# Patient Record
Sex: Female | Born: 1995 | Race: Black or African American | Hispanic: No | Marital: Married | Smoking: Never smoker
Health system: Southern US, Community
[De-identification: ages and names within clinical notes are randomized; demographics above are authoritative.]

## PROBLEM LIST (undated history)

## (undated) ENCOUNTER — Inpatient Hospital Stay (HOSPITAL_COMMUNITY): Payer: Self-pay

## (undated) ENCOUNTER — Emergency Department (HOSPITAL_COMMUNITY): Admission: EM | Payer: BC Managed Care – PPO | Source: Home / Self Care

## (undated) DIAGNOSIS — J45909 Unspecified asthma, uncomplicated: Secondary | ICD-10-CM

## (undated) HISTORY — PX: NO PAST SURGERIES: SHX2092

---

## 2016-06-10 ENCOUNTER — Ambulatory Visit (HOSPITAL_COMMUNITY)
Admission: EM | Admit: 2016-06-10 | Discharge: 2016-06-10 | Disposition: A | Discharge status: Home / Self Care | Payer: Self-pay

## 2016-06-10 ENCOUNTER — Encounter (HOSPITAL_COMMUNITY): Payer: Self-pay | Admitting: Family Medicine

## 2016-06-10 DIAGNOSIS — S161XXA Strain of muscle, fascia and tendon at neck level, initial encounter: Secondary | ICD-10-CM
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
## 2017-12-23 ENCOUNTER — Emergency Department (HOSPITAL_COMMUNITY): Payer: Self-pay

## 2017-12-23 ENCOUNTER — Emergency Department (HOSPITAL_COMMUNITY)
Admission: EM | Admit: 2017-12-23 | Discharge: 2017-12-23 | Disposition: A | Discharge status: Home / Self Care | Payer: Self-pay | Attending: Emergency Medicine | Admitting: Emergency Medicine

## 2017-12-23 ENCOUNTER — Encounter (HOSPITAL_COMMUNITY): Payer: Self-pay

## 2017-12-23 DIAGNOSIS — B9789 Other viral agents as the cause of diseases classified elsewhere: Secondary | ICD-10-CM | POA: Insufficient documentation

## 2017-12-23 DIAGNOSIS — R062 Wheezing: Secondary | ICD-10-CM | POA: Insufficient documentation

## 2017-12-23 DIAGNOSIS — J3489 Other specified disorders of nose and nasal sinuses: Secondary | ICD-10-CM | POA: Insufficient documentation

## 2017-12-23 DIAGNOSIS — R0981 Nasal congestion: Secondary | ICD-10-CM | POA: Insufficient documentation

## 2017-12-23 DIAGNOSIS — J069 Acute upper respiratory infection, unspecified: Secondary | ICD-10-CM | POA: Insufficient documentation

## 2017-12-23 DIAGNOSIS — J45909 Unspecified asthma, uncomplicated: Secondary | ICD-10-CM | POA: Insufficient documentation

## 2017-12-23 DIAGNOSIS — Z209 Contact with and (suspected) exposure to unspecified communicable disease: Secondary | ICD-10-CM | POA: Insufficient documentation

## 2017-12-23 HISTORY — DX: Unspecified asthma, uncomplicated: J45.909

## 2017-12-23 MED ORDER — IPRATROPIUM-ALBUTEROL 0.5-2.5 (3) MG/3ML IN SOLN
3.0000 mL | Freq: Once | RESPIRATORY_TRACT | Status: AC
  Administered 2017-12-23: 3 mL via RESPIRATORY_TRACT
  Filled 2017-12-23: qty 3

## 2017-12-23 MED ORDER — CETIRIZINE HCL 10 MG PO TABS: 10.0000 mg | ORAL_TABLET | Freq: Every day | ORAL | 0 refills | Status: DC

## 2017-12-23 MED ORDER — ALBUTEROL SULFATE HFA 108 (90 BASE) MCG/ACT IN AERS
2.0000 | INHALATION_SPRAY | Freq: Once | RESPIRATORY_TRACT | Status: AC
  Administered 2017-12-23: 2 via RESPIRATORY_TRACT
  Filled 2017-12-23: qty 6.7

## 2017-12-23 MED ORDER — PREDNISONE 20 MG PO TABS
60.0000 mg | ORAL_TABLET | Freq: Once | ORAL | Status: AC
  Administered 2017-12-23: 60 mg via ORAL
  Filled 2017-12-23: qty 3

## 2017-12-23 MED ORDER — FLUTICASONE PROPIONATE 50 MCG/ACT NA SUSP: 2.0000 | Freq: Every day | NASAL | 0 refills | Status: DC

## 2017-12-23 MED ORDER — PREDNISONE 20 MG PO TABS: 40.0000 mg | ORAL_TABLET | Freq: Every day | ORAL | 0 refills | Status: AC

## 2017-12-23 NOTE — ED Provider Notes (Signed)
[facility]   CSN: [id number] Arrival date & time: 12/23/17  1642     History   Chief Complaint Chief Complaint  Patient presents with  . congestion/cough    HPI Shirley Navarro is a 22 y.o. female with a history of asthma who presents emergency department today for 2-day history of symptoms.  Patient states that 3 days ago she started having maxillary sinus pressure, congestion, rhinorrhea, and a dry, nonproductive cough.  She states that 2 days ago she had a subjective fever and has had chills since that time.  Yesterday she started having wheezing but is without home albuterol inhaler.  She has been taking over-the-counter sinus medication without relief.  No antipyretics prior to arrival.  Patient states that her last asthma exacerbation was one year ago.  No prior hospitalizations or intubations for asthma.  No daily medications for asthma.  She notes sick contacts.  Denies any headache, visual changes, sore throat, neck stiffness, rash, chest pain, chest tightness, shortness of breath, hemoptysis, lower leg swelling, abdominal pain, nausea/vomiting/diarrhea.  HPI  Past Medical History:  Diagnosis Date  . Asthma     There are no active problems to display for this patient.   History reviewed. No pertinent surgical history.  OB History    No data available       Home Medications    Prior to Admission medications   Not on File    Family History No family history on file.  Social History Social History   Tobacco Use  . Smoking status: Never Smoker  . Smokeless tobacco: Never Used  Substance Use Topics  . Alcohol use: Not on file  . Drug use: Not on file     Allergies   Patient has no known allergies.   Review of Systems Review of Systems  All other systems reviewed and are negative.    Physical Exam Updated Vital Signs BP 128/83 (BP Location: Right Arm)   Pulse 93   Temp 98.7 F (37.1 C)  (Oral)   Resp 18   LMP 12/14/2017   SpO2 100%   Physical Exam  Constitutional: She appears well-developed and well-nourished.  HENT:  Head: Normocephalic and atraumatic.  Right Ear: Tympanic membrane and external ear normal.  Left Ear: Tympanic membrane and external ear normal.  Nose: Mucosal edema present. No rhinorrhea. Right sinus exhibits maxillary sinus tenderness. Right sinus exhibits no frontal sinus tenderness. Left sinus exhibits maxillary sinus tenderness. Left sinus exhibits no frontal sinus tenderness.  Mouth/Throat: Uvula is midline, oropharynx is clear and moist and mucous membranes are normal. No tonsillar exudate.  The patient has normal phonation and is in control of secretions. No stridor.  Midline uvula without edema. Soft palate rises symmetrically.  No tonsillar erythema or exudates. No PTA. Tongue protrusion is normal. No trismus. No creptius on neck palpation and patient has good dentition. No gingival erythema or fluctuance noted. Mucus membranes moist.   Eyes: EOM are normal. Pupils are equal, round, and reactive to light. Right eye exhibits no discharge. Left eye exhibits no discharge. Right conjunctiva is not injected. Left conjunctiva is not injected. No scleral icterus. Right eye exhibits normal extraocular motion. Left eye exhibits normal extraocular motion.  Neck: Trachea normal, normal range of motion and full passive range of motion without pain. Neck supple. No spinous process tenderness present. No neck rigidity. Normal range of motion present.  No nuchal rigidity or meningismus  Cardiovascular: Normal rate, regular rhythm  and intact distal pulses.  No murmur heard. Pulses:      Radial pulses are 2+ on the right side, and 2+ on the left side.       Dorsalis pedis pulses are 2+ on the right side, and 2+ on the left side.       Posterior tibial pulses are 2+ on the right side, and 2+ on the left side.  No lower extremity swelling or edema. Calves symmetric in  size bilaterally.  Pulmonary/Chest: Effort normal. She has wheezes (end exp) in the right middle field and the right lower field. She exhibits no tenderness.  Abdominal: Soft. Bowel sounds are normal. She exhibits no distension. There is no tenderness. There is no rigidity, no rebound, no guarding and no CVA tenderness.  Musculoskeletal: She exhibits no edema.  Lymphadenopathy:    She has no cervical adenopathy.  Neurological: She is alert.  Skin: Skin is warm and dry. No petechiae, no purpura and no rash noted. She is not diaphoretic.  Psychiatric: She has a normal mood and affect.  Nursing note and vitals reviewed.    ED Treatments / Results  Labs (all labs ordered are listed, but only abnormal results are displayed) Labs Reviewed - No data to display  EKG  EKG Interpretation None       Radiology Dg Chest 2 View  Result Date: 12/23/2017 CLINICAL DATA:  22 year old female with 3 days of cough, congestion, fever. EXAM: CHEST - 2 VIEW COMPARISON:  None. FINDINGS: Normal lung volumes. Normal cardiac size and mediastinal contours. Visualized tracheal air column is within normal limits. No pneumothorax, pleural effusion or confluent pulmonary opacity. Mild diffuse increased pulmonary interstitial markings. No pulmonary edema suspected. No osseous abnormality identified. Negative visible bowel gas pattern. IMPRESSION: Mildly increased bilateral pulmonary interstitial and suspicious for acute viral/atypical respiratory infection in this clinical setting. No pleural effusion. Electronically Signed   By: Odessa Fleming M.D.   On: 12/23/2017 17:30    Procedures Procedures (including critical care time)  Medications Ordered in ED Medications  albuterol (PROVENTIL HFA;VENTOLIN HFA) 108 (90 Base) MCG/ACT inhaler 2 puff (2 puffs Inhalation Given 12/23/17 1749)  ipratropium-albuterol (DUONEB) 0.5-2.5 (3) MG/3ML nebulizer solution 3 mL (3 mLs Nebulization Given 12/23/17 1749)  predniSONE (DELTASONE)  tablet 60 mg (60 mg Oral Given 12/23/17 1749)     Initial Impression / Assessment and Plan / ED Course  I have reviewed the triage vital signs and the nursing notes.  Pertinent labs & imaging results that were available during my care of the patient were reviewed by me and considered in my medical decision making (see chart for details).     22 y.o. with subjective fevers, sinus pressure, congestion, rhinorrhea, and dry cough over the last 3 days. Xray negative for PNA. Patient exam c/w viral URI that has likely excaberated the patient asthma. Will treat the patient with duoneb in the department. After duoneb treatment patient states improvement of her symptoms. Lung exam with resolution of wheezing. Patient is satting at 100% on RA. Feel the patient can be treated on an outpatient bases. Albuterol inhaler given in the department. Patient will be discharged home on steroid taper. First dose given in department. Recommend conservative therapy. I advised the patient to follow-up with PCP this week. Specific return precautions discussed. Time was given for all questions to be answered. The patient verbalized understanding and agreement with plan. The patient appears safe for discharge home.  Final Clinical Impressions(s) / ED Diagnoses   Final  diagnoses:  Viral URI with cough  Wheezing    ED Discharge Orders        Ordered    fluticasone (FLONASE) 50 MCG/ACT nasal spray  Daily     12/23/17 1756    cetirizine (ZYRTEC) 10 MG tablet  Daily     12/23/17 1756    predniSONE (DELTASONE) 20 MG tablet  Daily     12/23/17 1756       Princella Pellegrini 12/23/17 1814    Tegeler, Canary Brim, MD 12/23/17 2351
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
## 2017-12-23 NOTE — ED Triage Notes (Signed)
Patient complains of 3 days of cough, congestion with chills and reported fever. States that she has asthma and out of inhaler. No wheezing noted, NAd

## 2017-12-23 NOTE — ED Notes (Signed)
Declined W/C at D/C and was escorted to lobby by RN. 

## 2017-12-26 ENCOUNTER — Encounter (HOSPITAL_COMMUNITY): Payer: Self-pay | Admitting: Family Medicine

## 2018-03-28 ENCOUNTER — Encounter (HOSPITAL_COMMUNITY): Payer: Self-pay

## 2018-03-28 ENCOUNTER — Emergency Department (HOSPITAL_COMMUNITY)
Admission: EM | Admit: 2018-03-28 | Discharge: 2018-03-28 | Disposition: A | Discharge status: Home / Self Care | Payer: Self-pay | Attending: Emergency Medicine | Admitting: Emergency Medicine

## 2018-03-28 DIAGNOSIS — J4521 Mild intermittent asthma with (acute) exacerbation: Secondary | ICD-10-CM | POA: Insufficient documentation

## 2018-03-28 DIAGNOSIS — Z79899 Other long term (current) drug therapy: Secondary | ICD-10-CM | POA: Insufficient documentation

## 2018-03-28 MED ORDER — ALBUTEROL SULFATE HFA 108 (90 BASE) MCG/ACT IN AERS: 1.0000 | INHALATION_SPRAY | Freq: Four times a day (QID) | RESPIRATORY_TRACT | 0 refills | Status: DC | PRN

## 2018-03-28 MED ORDER — AEROCHAMBER PLUS FLO-VU LARGE MISC
Status: AC
  Filled 2018-03-28: qty 1

## 2018-03-28 MED ORDER — ALBUTEROL SULFATE (2.5 MG/3ML) 0.083% IN NEBU
5.0000 mg | INHALATION_SOLUTION | Freq: Once | RESPIRATORY_TRACT | Status: AC
  Administered 2018-03-28: 5 mg via RESPIRATORY_TRACT
  Filled 2018-03-28: qty 6

## 2018-03-28 MED ORDER — AEROCHAMBER PLUS FLO-VU LARGE MISC
1.0000 | Freq: Once | Status: AC
  Administered 2018-03-28: 1

## 2018-03-28 NOTE — Discharge Instructions (Addendum)
Please read attached information. If you experience any new or worsening signs or symptoms please return to the emergency room for evaluation. Please follow-up with your primary care provider or specialist as discussed. Please use medication prescribed only as directed and discontinue taking if you have any concerning signs or symptoms.   °

## 2018-03-28 NOTE — ED Provider Notes (Signed)
[facility]   CSN: [id number] Arrival date & time: 03/28/18  1148     History   Chief Complaint Chief Complaint  Patient presents with  . Asthma    HPI Shirley Navarro is a 22 y.o. female.  HPI   22 year old female presents today with complaints of asthma exacerbation.  Patient she woke up at 4 AM this morning with chest tightness, wheezing and shortness of breath.  She notes she had an inhaler at home and used it but did not find relief of her symptoms.  She was told to come in by her work today for evaluation.  Patient notes a tightness in her anterior chest.  Prior to my evaluation she received breathing treatments which she notes significantly improved her symptoms both the tightness and the shortness of breath.  She denies any complicating features including fever or upper respiratory infectious symptoms.  Patient notes this is identical to previous episodes of asthma exacerbation.  Patient denies any history DVT or PE, denies any significant risk factors.  He is a non-smoker.  Past Medical History:  Diagnosis Date  . Asthma     There are no active problems to display for this patient.   History reviewed. No pertinent surgical history.   OB History   None      Home Medications    Prior to Admission medications   Medication Sig Start Date End Date Taking? Authorizing Provider  albuterol (PROVENTIL HFA;VENTOLIN HFA) 108 (90 Base) MCG/ACT inhaler Inhale 1-2 puffs into the lungs every 6 (six) hours as needed for wheezing or shortness of breath. 03/28/18   Hurman Ketelsen, Tinnie GensJeffrey, PA-C  cetirizine (ZYRTEC) 10 MG tablet Take 1 tablet (10 mg total) by mouth daily. 12/23/17   Maczis, Elmer SowMichael M, PA-C  fluticasone (FLONASE) 50 MCG/ACT nasal spray Place 2 sprays into both nostrils daily. 12/23/17   Maczis, Elmer SowMichael M, PA-C    Family History No family history on file.  Social History Social History   Tobacco Use  . Smoking  status: Never Smoker  . Smokeless tobacco: Never Used  Substance Use Topics  . Alcohol use: Not on file  . Drug use: Not on file     Allergies   Patient has no known allergies.   Review of Systems Review of Systems  All other systems reviewed and are negative.  Physical Exam Updated Vital Signs BP 117/74 (BP Location: Left Arm)   Pulse 74   Temp 98.1 F (36.7 C) (Oral)   Resp 16   SpO2 100%   Physical Exam  Constitutional: She is oriented to person, place, and time. She appears well-developed and well-nourished.  HENT:  Head: Normocephalic and atraumatic.  Eyes: Pupils are equal, round, and reactive to light. Conjunctivae are normal. Right eye exhibits no discharge. Left eye exhibits no discharge. No scleral icterus.  Neck: Normal range of motion. No JVD present. No tracheal deviation present.  Cardiovascular: Normal rate, regular rhythm, normal heart sounds and intact distal pulses. Exam reveals no gallop and no friction rub.  No murmur heard. Pulmonary/Chest: Effort normal and breath sounds normal. No stridor. No respiratory distress. She has no wheezes. She has no rales. She exhibits no tenderness.  Neurological: She is alert and oriented to person, place, and time. No cranial nerve deficit. Coordination normal.  Psychiatric: She has a normal mood and affect. Her behavior is normal. Judgment and thought content normal.  Nursing note and vitals reviewed.    ED Treatments /  Results  Labs (all labs ordered are listed, but only abnormal results are displayed) Labs Reviewed - No data to display  EKG None  Radiology No results found.  Procedures Procedures (including critical care time)  Medications Ordered in ED Medications  AEROCHAMBER PLUS FLO-VU LARGE MISC (has no administration in time range)  albuterol (PROVENTIL) (2.5 MG/3ML) 0.083% nebulizer solution 5 mg (5 mg Nebulization Given 03/28/18 1159)  AEROCHAMBER PLUS FLO-VU LARGE MISC 1 each (1 each Other  Given 03/28/18 1302)     Initial Impression / Assessment and Plan / ED Course  I have reviewed the triage vital signs and the nursing notes.  Pertinent labs & imaging results that were available during my care of the patient were reviewed by me and considered in my medical decision making (see chart for details).     Labs:   Imaging:  Consults:  Therapeutics:  Discharge Meds:   Assessment/Plan: 22 year old female presents today with asthma exacerbation.  She has clear lung sounds on my evaluation heart rate is 74 oxygen 100.  She has no signs of infectious etiology.  No signs of pulmonary embolism or any other acute life-threatening etiology.  She will be discharged with outpatient follow-up, space of her inhaler and strict return precautions.  She verbalized understanding and agreement to today's plan.   Final Clinical Impressions(s) / ED Diagnoses   Final diagnoses:  Mild intermittent asthma with exacerbation    ED Discharge Orders        Ordered    albuterol (PROVENTIL HFA;VENTOLIN HFA) 108 (90 Base) MCG/ACT inhaler  Every 6 hours PRN     03/28/18 1259       Eyvonne Mechanic, PA-C 03/28/18 1408    Gerhard Munch, MD 03/29/18 1343

## 2018-03-28 NOTE — ED Triage Notes (Signed)
Pt presents for evaluation of asthma flare up. Pt reports sob and intermittent chest tightness with coughing and breathing around 4 am this morning. Reports no PCP so doesn't have home inhaler. Ambualtory, speaking in complete sentences.

## 2018-05-06 ENCOUNTER — Emergency Department (HOSPITAL_COMMUNITY)
Admission: EM | Admit: 2018-05-06 | Discharge: 2018-05-06 | Disposition: A | Discharge status: Home / Self Care | Payer: Self-pay | Attending: Emergency Medicine | Admitting: Emergency Medicine

## 2018-05-06 ENCOUNTER — Other Ambulatory Visit: Payer: Self-pay

## 2018-05-06 ENCOUNTER — Encounter (HOSPITAL_COMMUNITY): Payer: Self-pay | Admitting: Emergency Medicine

## 2018-05-06 DIAGNOSIS — Y9389 Activity, other specified: Secondary | ICD-10-CM | POA: Insufficient documentation

## 2018-05-06 DIAGNOSIS — Y990 Civilian activity done for income or pay: Secondary | ICD-10-CM | POA: Insufficient documentation

## 2018-05-06 DIAGNOSIS — Y33XXXA Other specified events, undetermined intent, initial encounter: Secondary | ICD-10-CM | POA: Insufficient documentation

## 2018-05-06 DIAGNOSIS — Y9289 Other specified places as the place of occurrence of the external cause: Secondary | ICD-10-CM | POA: Insufficient documentation

## 2018-05-06 DIAGNOSIS — S39012A Strain of muscle, fascia and tendon of lower back, initial encounter: Secondary | ICD-10-CM | POA: Insufficient documentation

## 2018-05-06 LAB — POC URINE PREG, ED: PREG TEST UR: NEGATIVE

## 2018-05-06 MED ORDER — IBUPROFEN 800 MG PO TABS
800.0000 mg | ORAL_TABLET | Freq: Once | ORAL | Status: AC
  Administered 2018-05-06: 800 mg via ORAL
  Filled 2018-05-06: qty 1

## 2018-05-06 MED ORDER — CYCLOBENZAPRINE HCL 10 MG PO TABS: 10.0000 mg | ORAL_TABLET | Freq: Two times a day (BID) | ORAL | 0 refills | Status: DC | PRN

## 2018-05-06 MED ORDER — IBUPROFEN 600 MG PO TABS: 600.0000 mg | ORAL_TABLET | Freq: Four times a day (QID) | ORAL | 0 refills | Status: DC | PRN

## 2018-05-06 NOTE — ED Notes (Signed)
Patient verbalizes understanding of discharge instructions. Opportunity for questioning and answers were provided. Armband removed by staff, pt discharged from ED ambulatory.   

## 2018-05-06 NOTE — ED Notes (Signed)
Patient states back pain x 2 days, starting at work. Patient states aleve, heating pad do not work. Patient states pain constant with increasing pain at time x 2 days.

## 2018-05-06 NOTE — ED Notes (Signed)
Pt aware of need for urine sample, cup provided.

## 2018-05-06 NOTE — ED Triage Notes (Signed)
Pt. Stated, Ive had back pain for 24 hours. 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

## 2018-05-06 NOTE — ED Provider Notes (Signed)
[facility] Provider Note   CSN: [id number] Arrival date & time: 05/06/18  0753     History   Chief Complaint Chief Complaint  Patient presents with  . Back Pain    HPI Sabra HeckZykeria Stanbrough is a 22 y.o. female.  The history is provided by the patient. No language interpreter was used.  Back Pain   Pertinent negatives include no fever, no numbness and no dysuria.     22 year old female presenting for evaluation of back pain.  Patient works as a LawyerCNA on night shift.  2 nights ago after her shift she noticed pain to her lower back.  She described pain as a sharp throbbing achy sensation, with tightness, worsening with movement and improves with rest.  Pain is currently rated as 8 out of 10.  She has back pain in the past but this is more intense.  Pain did not radiates down to her back to her abdomen.  She denies any associated fever, chills, lightheadedness, dizziness, hematuria, dysuria, bowel bladder incontinence or saddle anesthesia.  She does endorse a prolonged menstruation for the past 20 days which is unusual for her.  She has took Advil yesterday.  She was able to finish her night shift tonight with assistance.  Past Medical History:  Diagnosis Date  . Asthma     There are no active problems to display for this patient.   History reviewed. No pertinent surgical history.   OB History   None      Home Medications    Prior to Admission medications   Medication Sig Start Date End Date Taking? Authorizing Provider  albuterol (PROVENTIL HFA;VENTOLIN HFA) 108 (90 Base) MCG/ACT inhaler Inhale 1-2 puffs into the lungs every 6 (six) hours as needed for wheezing or shortness of breath. 03/28/18   Hedges, Tinnie GensJeffrey, PA-C  cetirizine (ZYRTEC) 10 MG tablet Take 1 tablet (10 mg total) by mouth daily. 12/23/17   Maczis, Elmer SowMichael M, PA-C  fluticasone (FLONASE) 50 MCG/ACT nasal spray Place 2 sprays into both nostrils daily. 12/23/17   Maczis, Elmer SowMichael M,  PA-C    Family History No family history on file.  Social History Social History   Tobacco Use  . Smoking status: Never Smoker  . Smokeless tobacco: Never Used  Substance Use Topics  . Alcohol use: Not on file  . Drug use: Not on file     Allergies   Codeine   Review of Systems Review of Systems  Constitutional: Negative for fever.  Genitourinary: Negative for dysuria and hematuria.  Musculoskeletal: Positive for back pain.  Neurological: Negative for numbness.     Physical Exam Updated Vital Signs BP 123/66 (BP Location: Right Arm)   Pulse 85   Temp 98.7 F (37.1 C) (Oral)   Resp 17   Ht 5\' 5"  (1.651 m)   Wt 70.8 kg (156 lb)   LMP 04/14/2018   SpO2 100%   BMI 25.96 kg/m   Physical Exam  Constitutional: She appears well-developed and well-nourished. No distress.  HENT:  Head: Atraumatic.  Eyes: Conjunctivae are normal.  Neck: Neck supple.  Abdominal: Soft. She exhibits no distension. There is no tenderness.  Musculoskeletal: She exhibits tenderness (Tenderness along lumbar spine and left paralumbar spinal muscle on palpation with full range of motion.  Negative straight leg raise.).  Neurological: She is alert.  Patellar deep tendon reflex intact bilaterally, no foot drops  Skin: No rash noted.  Psychiatric: She has a normal mood and affect.  Nursing note and vitals reviewed.    ED Treatments / Results  Labs (all labs ordered are listed, but only abnormal results are displayed) Labs Reviewed  POC URINE PREG, ED    EKG None  Radiology No results found.  Procedures Procedures (including critical care time)  Medications Ordered in ED Medications - No data to display   Initial Impression / Assessment and Plan / ED Course  I have reviewed the triage vital signs and the nursing notes.  Pertinent labs & imaging results that were available during my care of the patient were reviewed by me and considered in my medical decision making (see  chart for details).     BP 123/66 (BP Location: Right Arm)   Pulse 85   Temp 98.7 F (37.1 C) (Oral)   Resp 17   Ht 5\' 5"  (1.651 m)   Wt 70.8 kg (156 lb)   LMP 04/14/2018   SpO2 100%   BMI 25.96 kg/m    Final Clinical Impressions(s) / ED Diagnoses   Final diagnoses:  Strain of lumbar region, initial encounter    ED Discharge Orders    None     8:49 AM Patient here with reproducible back pain.  No red flags.  Able to ambulate.  She is neurovascularly intact.  Suspect this is likely to be muscle skeletal strain due to her job as a Doctor, hospital heavy patient.  Low suspicion for fracture or dislocation.  She did report a prolonged menstrual.  Lasting for 20 days.  She does not have any abdominal discomfort however will obtain pregnancy test prior to discharge.  BP 123/66 (BP Location: Right Arm)   Pulse 85   Temp 98.7 F (37.1 C) (Oral)   Resp 17   Ht 5\' 5"  (1.651 m)   Wt 70.8 kg (156 lb)   LMP 04/14/2018   SpO2 100%   BMI 25.96 kg/m  Pregnancy test is negative.  Patient given ibuprofen for her back pain.  Patient discharged home with muscle relaxant and anti-inflammatory medication.  Orthopedic referral given as needed.  Work note provided.  Rice therapy discussed.   Fayrene Helper, PA-C 05/06/18 1610    Blane Ohara, MD 05/07/18 [phone]

## 2019-02-13 IMAGING — DX DG CHEST 2V
2 series · 2 of 2 positions shown · non-contrast
Comparison: None.

CLINICAL DATA: 21-year-old female with 3 days of cough, congestion,
fever.

EXAM:
CHEST - 2 VIEW

[chest pa]
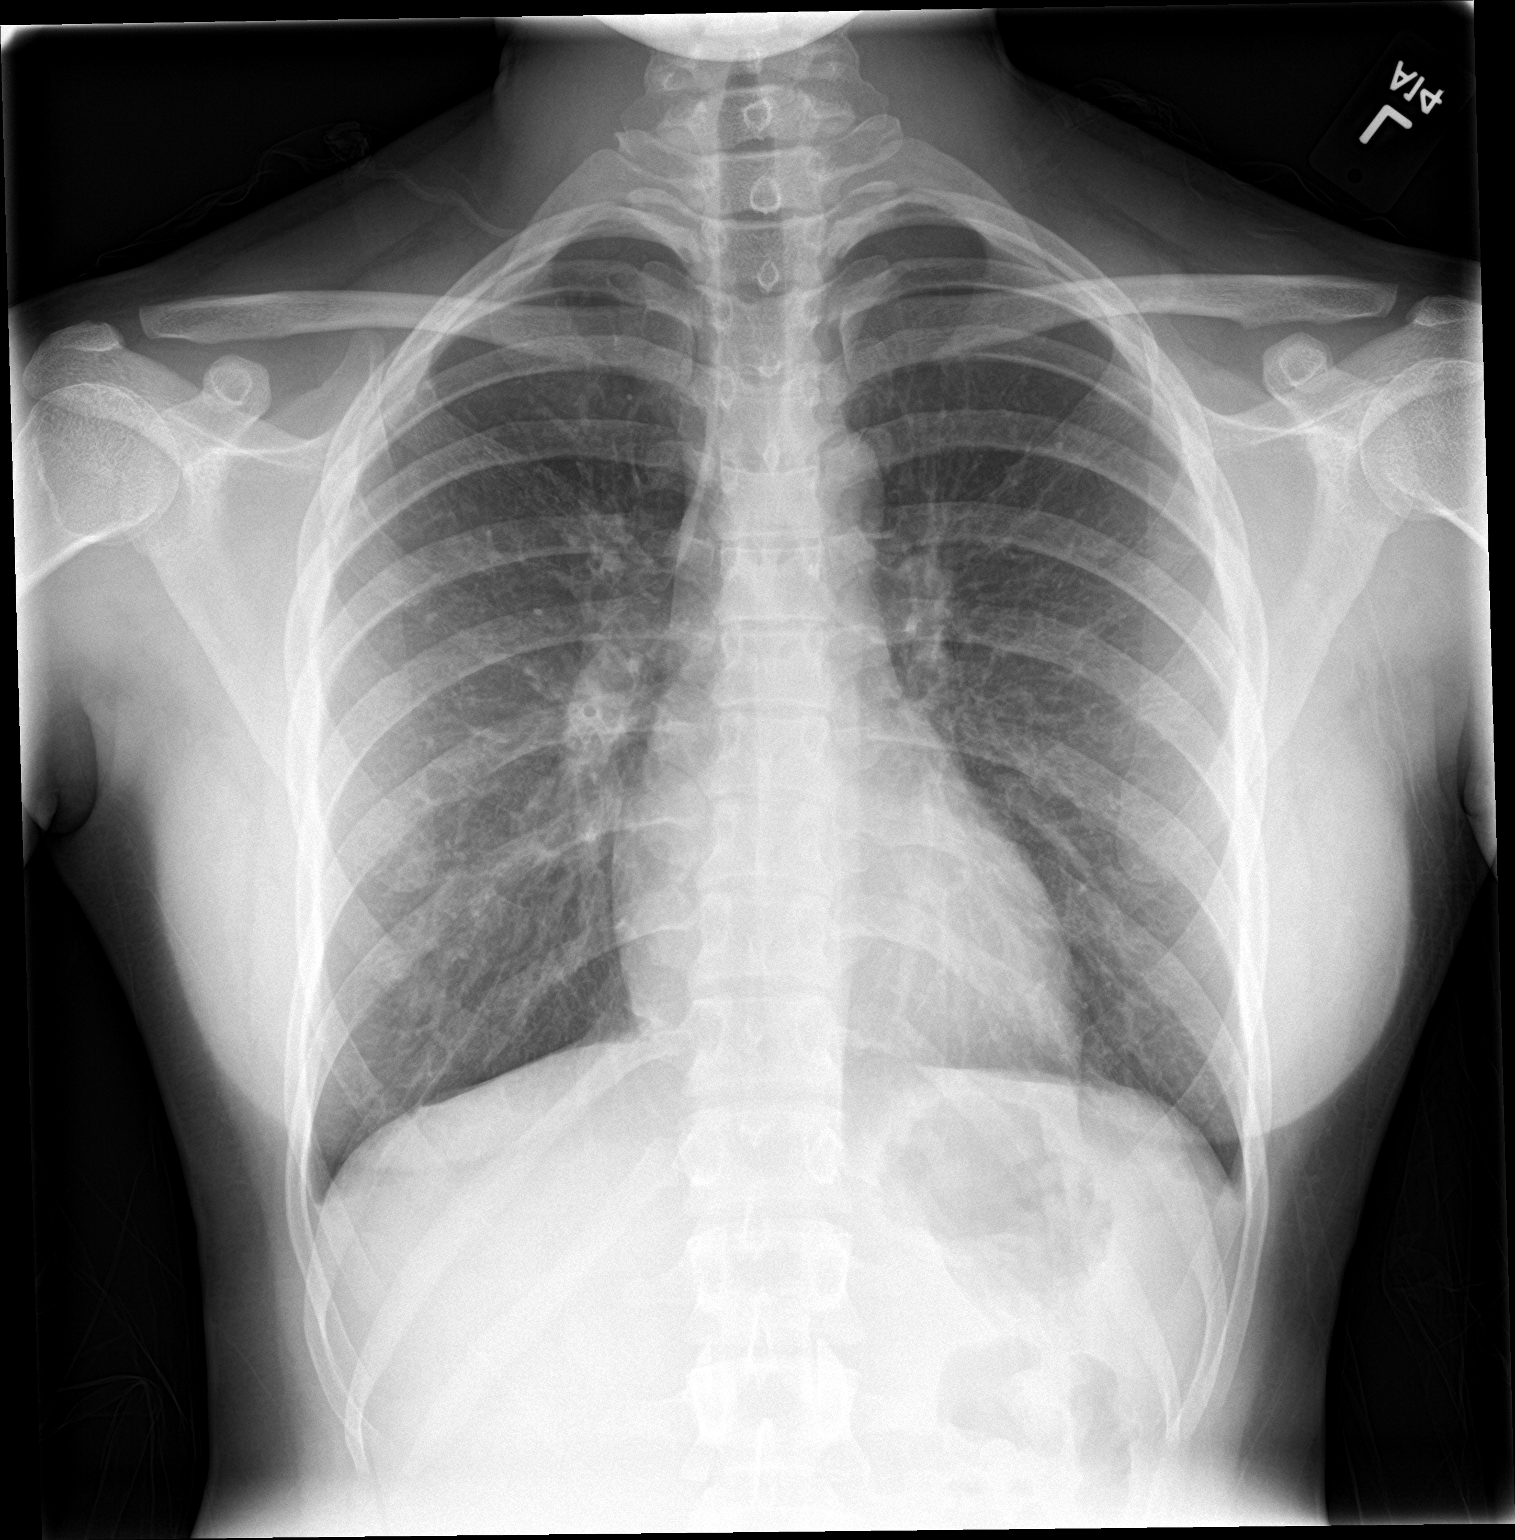

[chest lat]
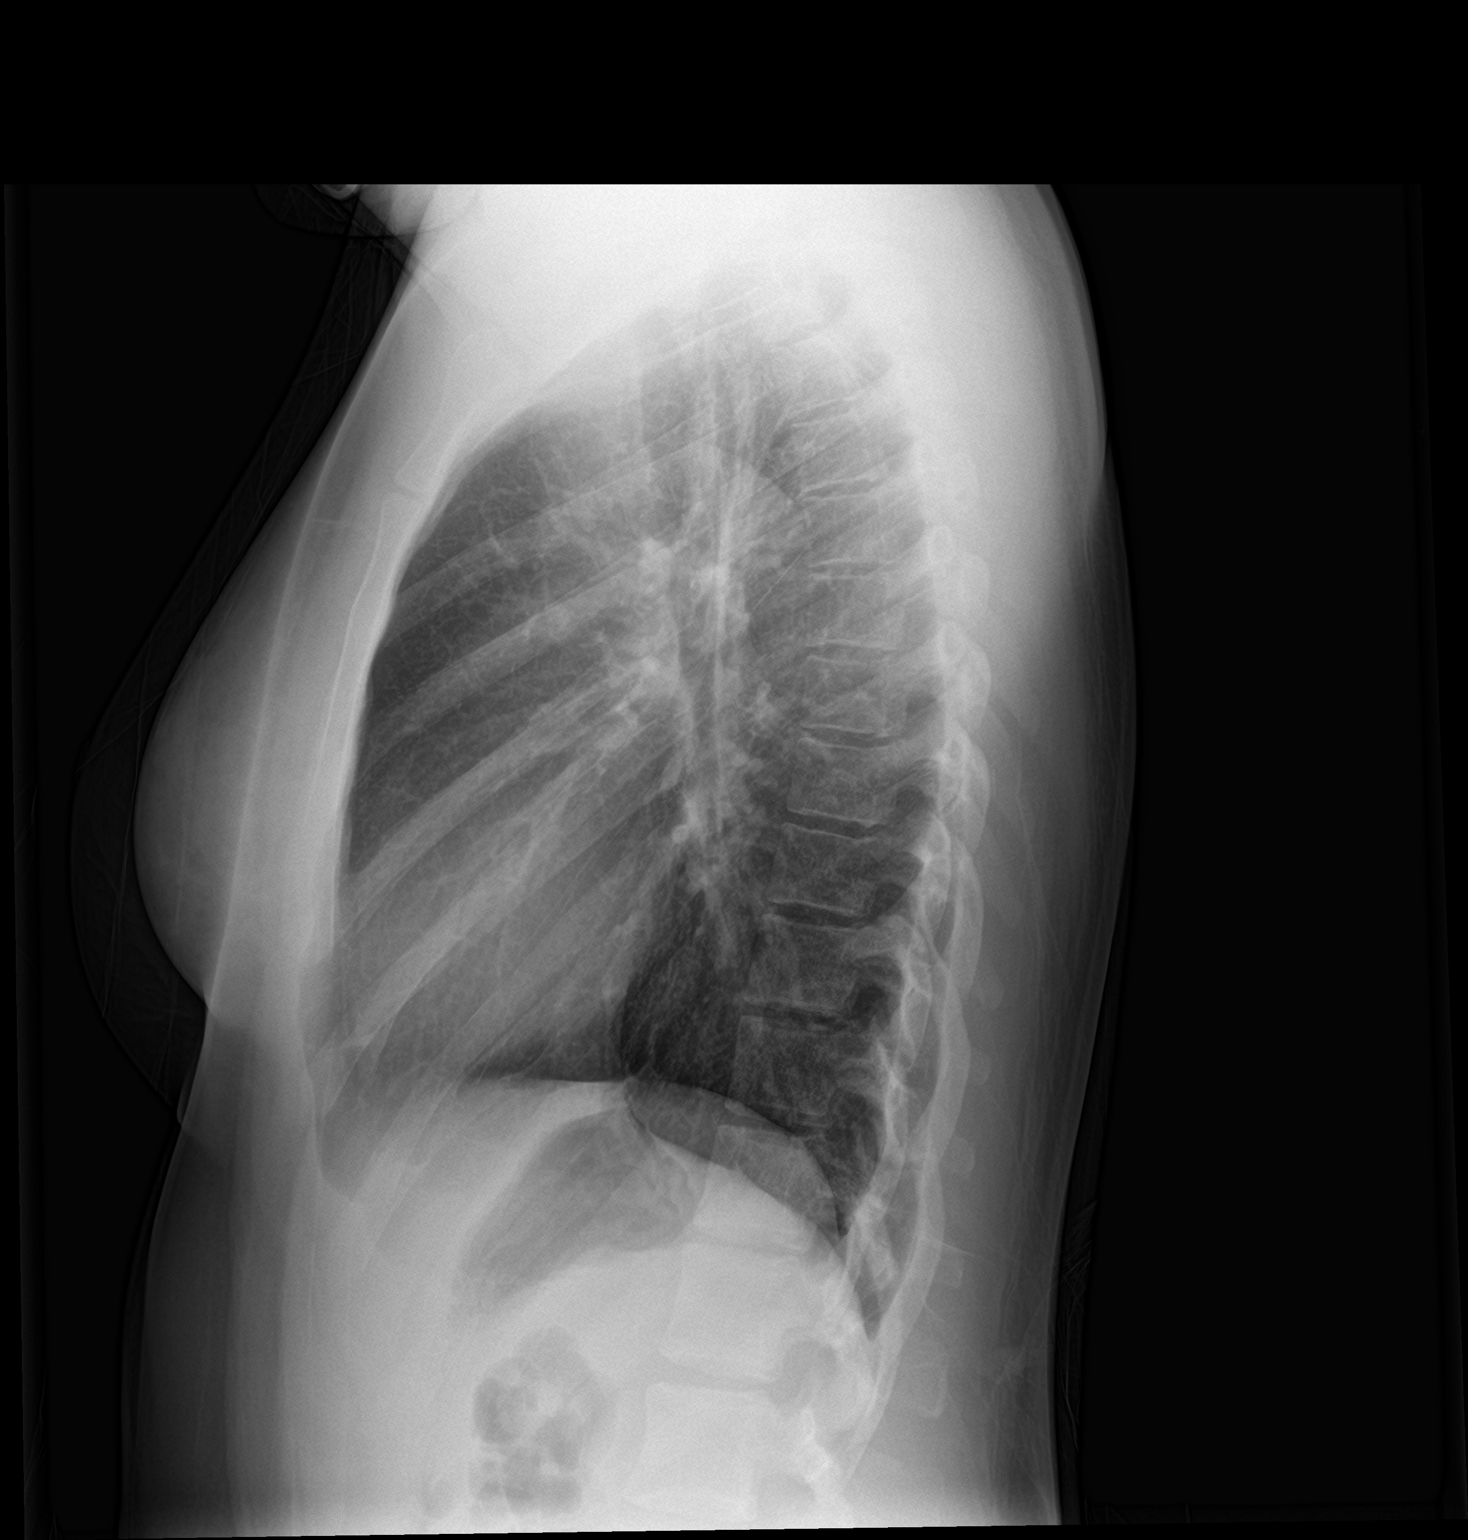

[2 of 2 positions shown; findings below may reference images not displayed]

FINDINGS: Normal lung volumes. Normal cardiac size and mediastinal contours.
Visualized tracheal air column is within normal limits. No
pneumothorax, pleural effusion or confluent pulmonary opacity. Mild
diffuse increased pulmonary interstitial markings. No pulmonary
edema suspected. No osseous abnormality identified. Negative visible
bowel gas pattern.
IMPRESSION: Mildly increased bilateral pulmonary interstitial and suspicious for
acute viral/atypical respiratory infection in this clinical setting.
No pleural effusion.

## 2019-03-12 ENCOUNTER — Ambulatory Visit (HOSPITAL_COMMUNITY)
Admission: EM | Admit: 2019-03-12 | Discharge: 2019-03-12 | Disposition: A | Discharge status: Home / Self Care | Payer: BC Managed Care – PPO | Attending: Family Medicine | Admitting: Family Medicine

## 2019-03-12 ENCOUNTER — Ambulatory Visit (HOSPITAL_COMMUNITY)
Admission: RE | Admit: 2019-03-12 | Discharge: 2019-03-12 | Disposition: A | Discharge status: Home / Self Care | Payer: BC Managed Care – PPO | Source: Ambulatory Visit | Attending: Family Medicine | Admitting: Family Medicine

## 2019-03-12 ENCOUNTER — Other Ambulatory Visit: Payer: Self-pay

## 2019-03-12 ENCOUNTER — Telehealth (HOSPITAL_COMMUNITY): Payer: Self-pay | Admitting: Family Medicine

## 2019-03-12 ENCOUNTER — Encounter (HOSPITAL_COMMUNITY): Payer: Self-pay | Admitting: Family Medicine

## 2019-03-12 DIAGNOSIS — M79602 Pain in left arm: Secondary | ICD-10-CM | POA: Insufficient documentation

## 2019-03-12 DIAGNOSIS — M79609 Pain in unspecified limb: Secondary | ICD-10-CM | POA: Diagnosis not present

## 2019-03-12 MED ORDER — NAPROXEN 500 MG PO TABS: 500.0000 mg | ORAL_TABLET | Freq: Two times a day (BID) | ORAL | 0 refills | Status: DC

## 2019-03-12 NOTE — Telephone Encounter (Signed)
Negative ultrasound

## 2019-03-12 NOTE — ED Provider Notes (Signed)
[facility]    CSN: [id number] Arrival date & time: 03/12/19  1753     History   Chief Complaint Chief Complaint  Patient presents with   Arm Pain    HPI Shirley Navarro is a 23 y.o. female.   Established [facility] patient.  23 yo woman complaining of left arm pain and numbness.  This began a day or two ago and is worsening, with the most intense pain over her Nexplanon (23 years old).  No recent exercise, trauma, fever, hand swelling.  No treatment attempted.  Patient works as Lawyer     Past Medical History:  Diagnosis Date   Asthma     There are no active problems to display for this patient.   History reviewed. No pertinent surgical history.  OB History   No obstetric history on file.      Home Medications    Prior to Admission medications   Medication Sig Start Date End Date Taking? Authorizing Provider  albuterol (PROVENTIL HFA;VENTOLIN HFA) 108 (90 Base) MCG/ACT inhaler Inhale 1-2 puffs into the lungs every 6 (six) hours as needed for wheezing or shortness of breath. 03/28/18   Eyvonne Mechanic, PA-C    Family History History reviewed. No pertinent family history.  Social History Social History   Tobacco Use   Smoking status: Never Smoker   Smokeless tobacco: Never Used  Substance Use Topics   Alcohol use: Not on file   Drug use: Not on file     Allergies   Codeine   Review of Systems Review of Systems  Respiratory: Negative for shortness of breath.   All other systems reviewed and are negative.    Physical Exam Triage Vital Signs ED Triage Vitals  Enc Vitals Group     BP      Pulse      Resp      Temp      Temp src      SpO2      Weight      Height      Head Circumference      Peak Flow      Pain Score      Pain Loc      Pain Edu?      Excl. in GC?    No data found.  Updated Vital Signs BP 110/82 (BP Location: Right Arm)    Pulse 79    Temp 98.5 F (36.9 C) (Oral)    Resp 16    Wt 61.2 kg    SpO2 98%     BMI 22.47 kg/m    Physical Exam Vitals signs and nursing note reviewed.  Constitutional:      Appearance: Normal appearance.  HENT:     Head: Normocephalic.  Eyes:     Conjunctiva/sclera: Conjunctivae normal.  Pulmonary:     Effort: Pulmonary effort is normal.  Musculoskeletal: Normal range of motion.        General: Tenderness present.     Comments: Tender LUE inside of biceps with especially tender Nexplanon sub q site.  Tenderness extends to proximal volar left forearm and up the arm to the shoulder.  Skin:    General: Skin is warm and dry.  Neurological:     General: No focal deficit present.     Mental Status: She is alert.  Psychiatric:        Mood and Affect: Mood normal.        Behavior: Behavior normal.  Thought Content: Thought content normal.        Judgment: Judgment normal.      UC Treatments / Results  Labs (all labs ordered are listed, but only abnormal results are displayed) Labs Reviewed - No data to display  EKG None  Radiology No results found.  Procedures Procedures (including critical care time)  Medications Ordered in UC Medications - No data to display  Initial Impression / Assessment and Plan / UC Course  I have reviewed the triage vital signs and the nursing notes.  Pertinent labs & imaging results that were available during my care of the patient were reviewed by me and considered in my medical decision making (see chart for details).   this appears to be a basilic vein phlebitis.   Final Clinical Impressions(s) / UC Diagnoses   Final diagnoses:  Left arm pain     Discharge Instructions     Go to the emergency department right now and they will do the venous doppler of your left arm    ED Prescriptions    None     Controlled Substance Prescriptions [place] Controlled Substance Registry consulted? Not Applicable   Elvina SidleLauenstein, Kimball Appleby, MD 03/12/19 [phone]

## 2019-03-12 NOTE — Progress Notes (Signed)
Left upper extremity venous duplex has been completed. Preliminary results can be found in CV Proc through chart review.   03/12/19 7:12 PM Olen Cordial RVT

## 2019-03-12 NOTE — Discharge Instructions (Addendum)
Go to the emergency department right now and they will do the venous doppler of your left arm

## 2019-03-12 NOTE — ED Triage Notes (Signed)
Pt states she has pain in her left arm where her birth control implant is. Pt states her arm is numb painful.

## 2019-03-20 DIAGNOSIS — Z20828 Contact with and (suspected) exposure to other viral communicable diseases: Secondary | ICD-10-CM | POA: Diagnosis not present

## 2019-06-11 DIAGNOSIS — Z3046 Encounter for surveillance of implantable subdermal contraceptive: Secondary | ICD-10-CM | POA: Diagnosis not present

## 2019-06-11 DIAGNOSIS — Z8619 Personal history of other infectious and parasitic diseases: Secondary | ICD-10-CM | POA: Diagnosis not present

## 2019-07-01 DIAGNOSIS — Z01419 Encounter for gynecological examination (general) (routine) without abnormal findings: Secondary | ICD-10-CM | POA: Diagnosis not present

## 2019-07-01 DIAGNOSIS — Z6824 Body mass index (BMI) 24.0-24.9, adult: Secondary | ICD-10-CM | POA: Diagnosis not present

## 2019-07-01 DIAGNOSIS — N898 Other specified noninflammatory disorders of vagina: Secondary | ICD-10-CM | POA: Diagnosis not present

## 2019-09-04 DIAGNOSIS — Z1159 Encounter for screening for other viral diseases: Secondary | ICD-10-CM | POA: Diagnosis not present

## 2019-09-04 DIAGNOSIS — Z118 Encounter for screening for other infectious and parasitic diseases: Secondary | ICD-10-CM | POA: Diagnosis not present

## 2019-09-04 DIAGNOSIS — Z113 Encounter for screening for infections with a predominantly sexual mode of transmission: Secondary | ICD-10-CM | POA: Diagnosis not present

## 2019-09-04 DIAGNOSIS — Z114 Encounter for screening for human immunodeficiency virus [HIV]: Secondary | ICD-10-CM | POA: Diagnosis not present

## 2019-09-04 DIAGNOSIS — R1031 Right lower quadrant pain: Secondary | ICD-10-CM | POA: Diagnosis not present

## 2019-09-04 LAB — HEPATITIS C ANTIBODY: HCV Ab: NEGATIVE

## 2019-09-10 DIAGNOSIS — R1031 Right lower quadrant pain: Secondary | ICD-10-CM | POA: Diagnosis not present

## 2019-10-02 DIAGNOSIS — L292 Pruritus vulvae: Secondary | ICD-10-CM | POA: Diagnosis not present

## 2019-10-02 DIAGNOSIS — B373 Candidiasis of vulva and vagina: Secondary | ICD-10-CM | POA: Diagnosis not present

## 2019-10-02 DIAGNOSIS — Z118 Encounter for screening for other infectious and parasitic diseases: Secondary | ICD-10-CM | POA: Diagnosis not present

## 2019-10-11 NOTE — L&D Delivery Note (Addendum)
Shirley Navarro is a 24 y.o.@ s/p vaginal delivery at [redacted]w[redacted]d.  She was admitted for post dates eIOL.    ROM: 9h 67m with clear fluid GBS Status: positive Maximum Maternal Temperature: 98.6 F   Labor Progress: Patient in latent labor on admission. AROM was performed for clear white fluid. Pitocin was also initiated and titrated appropriately. Patient was later noted to have complete cervical dilation. She then delivered without complication as noted below.   Delivery Date/Time: 09/19/2020 at 0433 Delivery: Called to room and patient was complete and pushing. Head delivered ROP. No nuchal cord present. Shoulder and body delivered in usual fashion. Infant with spontaneous cry, placed on mother's abdomen, dried and stimulated. Cord clamped x 2 after 1-minute delay, and cut by father of baby under my direct supervision. Cord blood drawn. Placenta delivered spontaneously with gentle cord traction. Fundus firm with massage and Pitocin. Labia, perineum, vagina, and cervix were inspected; 1st degree left labial laceration visualized with repair.    Placenta: intact, 3-vessel cord, sent to L&D Complications: none Lacerations: 1st degree left labial with repair  EBL: 200 ml Analgesia: epidural   Infant: female   APGARs 9 & 9   weight per medical record  Anupa Ganta, DO  I was present and gloved for entirety of the delivery. I agree with the findings and the plan of care as documented in the residents note.  Casper Harrison, MD [facility] Fellow, [facility], [facility]

## 2019-12-19 DIAGNOSIS — Z114 Encounter for screening for human immunodeficiency virus [HIV]: Secondary | ICD-10-CM | POA: Diagnosis not present

## 2019-12-19 DIAGNOSIS — Z118 Encounter for screening for other infectious and parasitic diseases: Secondary | ICD-10-CM | POA: Diagnosis not present

## 2019-12-19 DIAGNOSIS — Z113 Encounter for screening for infections with a predominantly sexual mode of transmission: Secondary | ICD-10-CM | POA: Diagnosis not present

## 2019-12-19 DIAGNOSIS — Z1159 Encounter for screening for other viral diseases: Secondary | ICD-10-CM | POA: Diagnosis not present

## 2020-01-07 DIAGNOSIS — R4586 Emotional lability: Secondary | ICD-10-CM | POA: Diagnosis not present

## 2020-01-07 DIAGNOSIS — F17200 Nicotine dependence, unspecified, uncomplicated: Secondary | ICD-10-CM | POA: Diagnosis not present

## 2020-01-13 DIAGNOSIS — Z3200 Encounter for pregnancy test, result unknown: Secondary | ICD-10-CM | POA: Diagnosis not present

## 2020-01-13 DIAGNOSIS — Z3689 Encounter for other specified antenatal screening: Secondary | ICD-10-CM | POA: Diagnosis not present

## 2020-01-13 DIAGNOSIS — O26851 Spotting complicating pregnancy, first trimester: Secondary | ICD-10-CM | POA: Diagnosis not present

## 2020-01-13 DIAGNOSIS — Z3A01 Less than 8 weeks gestation of pregnancy: Secondary | ICD-10-CM | POA: Diagnosis not present

## 2020-01-17 DIAGNOSIS — O26859 Spotting complicating pregnancy, unspecified trimester: Secondary | ICD-10-CM | POA: Diagnosis not present

## 2020-01-17 DIAGNOSIS — Z3A08 8 weeks gestation of pregnancy: Secondary | ICD-10-CM | POA: Diagnosis not present

## 2020-01-23 DIAGNOSIS — Z3201 Encounter for pregnancy test, result positive: Secondary | ICD-10-CM | POA: Diagnosis not present

## 2020-01-23 DIAGNOSIS — Z0000 Encounter for general adult medical examination without abnormal findings: Secondary | ICD-10-CM | POA: Diagnosis not present

## 2020-02-05 DIAGNOSIS — Z3201 Encounter for pregnancy test, result positive: Secondary | ICD-10-CM | POA: Diagnosis not present

## 2020-02-07 ENCOUNTER — Other Ambulatory Visit: Payer: Self-pay

## 2020-02-07 ENCOUNTER — Inpatient Hospital Stay (HOSPITAL_COMMUNITY)
Admission: AD | Admit: 2020-02-07 | Discharge: 2020-02-07 | Disposition: A | Discharge status: Home / Self Care | Payer: BC Managed Care – PPO | Attending: Obstetrics & Gynecology | Admitting: Obstetrics & Gynecology

## 2020-02-07 ENCOUNTER — Ambulatory Visit (HOSPITAL_COMMUNITY)
Admission: EM | Admit: 2020-02-07 | Discharge: 2020-02-07 | Disposition: A | Discharge status: Home / Self Care | Payer: BC Managed Care – PPO | Source: Home / Self Care

## 2020-02-07 ENCOUNTER — Encounter (HOSPITAL_COMMUNITY): Payer: Self-pay | Admitting: Obstetrics & Gynecology

## 2020-02-07 ENCOUNTER — Inpatient Hospital Stay (HOSPITAL_COMMUNITY): Payer: BC Managed Care – PPO

## 2020-02-07 DIAGNOSIS — O26891 Other specified pregnancy related conditions, first trimester: Secondary | ICD-10-CM

## 2020-02-07 DIAGNOSIS — Z3A08 8 weeks gestation of pregnancy: Secondary | ICD-10-CM | POA: Diagnosis not present

## 2020-02-07 DIAGNOSIS — Z3491 Encounter for supervision of normal pregnancy, unspecified, first trimester: Secondary | ICD-10-CM

## 2020-02-07 DIAGNOSIS — R109 Unspecified abdominal pain: Secondary | ICD-10-CM | POA: Diagnosis not present

## 2020-02-07 DIAGNOSIS — Z885 Allergy status to narcotic agent status: Secondary | ICD-10-CM | POA: Insufficient documentation

## 2020-02-07 LAB — URINALYSIS, ROUTINE W REFLEX MICROSCOPIC
Bilirubin Urine: NEGATIVE
Glucose, UA: NEGATIVE mg/dL
Hgb urine dipstick: NEGATIVE
Ketones, ur: NEGATIVE mg/dL
Leukocytes,Ua: NEGATIVE
Nitrite: NEGATIVE
Protein, ur: NEGATIVE mg/dL
Specific Gravity, Urine: 1.014 (ref 1.005–1.030)
pH: 8 (ref 5.0–8.0)

## 2020-02-07 LAB — POCT PREGNANCY, URINE: Preg Test, Ur: POSITIVE — AB

## 2020-02-07 NOTE — MAU Note (Signed)
Presents with c/o lower abdominal pain, reports constant pressure sensation & intermittent sharp pains bilaterally in lower abdomen.  Reports pain also radiates into lower back.  Denies VB.  LMP 12/12/2019.

## 2020-02-07 NOTE — MAU Provider Note (Signed)
Chief Complaint: Abdominal Pain and Back Pain   First Provider Initiated Contact with Patient 02/07/20 0955     SUBJECTIVE HPI: Shirley Navarro is a 24 y.o. G1P0 at [redacted]w[redacted]d who presents to Maternity Admissions reporting abdominal pain. Symptoms started last night. Reports suprapubic pressure and sharp pains that alternate sides. Has been having nausea & vomiting throughout the pregnancy but hasn't vomited today. Denies dysuria, diarrhea, fever, vaginal bleeding, or vaginal discharge. Saw Dr. Juliene Pina on Wednesday and had a normal ultrasound.   Location: abdomen Quality: pressure & cramping Severity: 7/10 on pain scale Duration: 1 day Timing: intermittent Modifying factors: none Associated signs and symptoms: nausea  Past Medical History:  Diagnosis Date  . Asthma    OB History  Gravida Para Term Preterm AB Living  1            SAB TAB Ectopic Multiple Live Births               # Outcome Date GA Lbr Len/2nd Weight Sex Delivery Anes PTL Lv  1 Current            Past Surgical History:  Procedure Laterality Date  . NO PAST SURGERIES     Social History   Socioeconomic History  . Marital status: Married    Spouse name: Not on file  . Number of children: Not on file  . Years of education: Not on file  . Highest education level: Not on file  Occupational History  . Not on file  Tobacco Use  . Smoking status: Never Smoker  . Smokeless tobacco: Never Used  Substance and Sexual Activity  . Alcohol use: Not Currently    Comment: Socially  . Drug use: Not Currently    Types: Marijuana    Comment: Last smoked  April 2021  . Sexual activity: Yes  Other Topics Concern  . Not on file  Social History Narrative   ** Merged History Encounter **       Social Determinants of Health   Financial Resource Strain:   . Difficulty of Paying Living Expenses:   Food Insecurity:   . Worried About Programme researcher, broadcasting/film/video in the Last Year:   . Barista in the Last Year:   Transportation  Needs:   . Freight forwarder (Medical):   Marland Kitchen Lack of Transportation (Non-Medical):   Physical Activity:   . Days of Exercise per Week:   . Minutes of Exercise per Session:   Stress:   . Feeling of Stress :   Social Connections:   . Frequency of Communication with Friends and Family:   . Frequency of Social Gatherings with Friends and Family:   . Attends Religious Services:   . Active Member of Clubs or Organizations:   . Attends Banker Meetings:   Marland Kitchen Marital Status:   Intimate Partner Violence:   . Fear of Current or Ex-Partner:   . Emotionally Abused:   Marland Kitchen Physically Abused:   . Sexually Abused:    Family History  Problem Relation Age of Onset  . Healthy Mother   . Healthy Father    No current facility-administered medications on file prior to encounter.   Current Outpatient Medications on File Prior to Encounter  Medication Sig Dispense Refill  . Prenatal Vit-Fe Fumarate-FA (MULTIVITAMIN-PRENATAL) 27-0.8 MG TABS tablet Take 1 tablet by mouth daily at 12 noon.    . progesterone (ENDOMETRIN) 100 MG vaginal insert Place 100 mg vaginally at bedtime.  Allergies  Allergen Reactions  . Codeine Rash    I have reviewed patient's Past Medical Hx, Surgical Hx, Family Hx, Social Hx, medications and allergies.   Review of Systems  Constitutional: Negative.   Gastrointestinal: Positive for abdominal pain and nausea. Negative for constipation, diarrhea and vomiting.  Genitourinary: Negative.     OBJECTIVE Patient Vitals for the past 24 hrs:  BP Temp Temp src Pulse Resp SpO2 Height Weight  02/07/20 1213 130/73 98.4 F (36.9 C) Oral 72 20 100 % - -  02/07/20 0934 118/74 98.1 F (36.7 C) Oral 87 20 100 % 5\' 5"  (1.651 m) 66 kg   Constitutional: Well-developed, well-nourished female in no acute distress.  Cardiovascular: normal rate & rhythm, no murmur Respiratory: normal rate and effort. Lung sounds clear throughout GI: Abd soft, non-tender, Pos BS x 4. No  guarding or rebound tenderness MS: Extremities nontender, no edema, normal ROM Neurologic: Alert and oriented x 4.  GU:   NEFG, no blood. Cervix closed   LAB RESULTS Results for orders placed or performed during the hospital encounter of 02/07/20 (from the past 24 hour(s))  Pregnancy, urine POC     Status: Abnormal   Collection Time: 02/07/20  9:08 AM  Result Value Ref Range   Preg Test, Ur POSITIVE (A) NEGATIVE  Urinalysis, Routine w reflex microscopic     Status: None   Collection Time: 02/07/20  9:12 AM  Result Value Ref Range   Color, Urine YELLOW YELLOW   APPearance CLEAR CLEAR   Specific Gravity, Urine 1.014 1.005 - 1.030   pH 8.0 5.0 - 8.0   Glucose, UA NEGATIVE NEGATIVE mg/dL   Hgb urine dipstick NEGATIVE NEGATIVE   Bilirubin Urine NEGATIVE NEGATIVE   Ketones, ur NEGATIVE NEGATIVE mg/dL   Protein, ur NEGATIVE NEGATIVE mg/dL   Nitrite NEGATIVE NEGATIVE   Leukocytes,Ua NEGATIVE NEGATIVE    IMAGING US OB LESS THAN 14 WEEKS WITH OB TRANSVAGINAL  Result Date: 02/07/2020 CLINICAL DATA:  24 year old pregnant female with pelvic pain. EXAM: OBSTETRIC <14 WK [place] AND TRANSVAGINAL OB US TECHNIQUE: Both transabdominal and transvaginal ultrasound examinations were performed for complete evaluation of the gestation as well as the maternal uterus, adnexal regions, and pelvic cul-de-sac. Transvaginal technique was performed to assess early pregnancy. COMPARISON:  None. FINDINGS: Intrauterine gestational sac: Single Yolk sac:  Visualized. Embryo:  Visualized. Cardiac Activity: Visualized. Heart Rate: 176 bpm CRL:  21.5 mm   8 w   5 d                  [place] EDC: 09/13/2020 Subchorionic hemorrhage:  None visualized. Maternal uterus/adnexae: The ovaries bilaterally are unremarkable. A trace amount of free pelvic fluid is noted. IMPRESSION: 1. Single living intrauterine gestation with estimated gestational age of [redacted] weeks 5 days. 2. No subchorionic hemorrhage. 3. Trace free pelvic fluid. Electronically  Signed   By: Margarette Canada M.D.   On: 02/07/2020 12:01    MAU COURSE Orders Placed This Encounter  Procedures  . US OB LESS THAN 14 WEEKS WITH OB TRANSVAGINAL  . Urinalysis, Routine w reflex microscopic  . Pregnancy, urine POC  . Discharge patient   No orders of the defined types were placed in this encounter.   MDM Pt had ultrasound in office 2 days ago that showed live IUP. No bleeding. Benign abdominal exam. Cervix closed.   Ultrasound ordered today and continues to show live IUP. No ovarian masses.  Pt reassured and reports resolution of pain without intervention.  ASSESSMENT 1. Normal IUP (intrauterine pregnancy) on prenatal ultrasound, first trimester   2. Abdominal pain during pregnancy in first trimester   3. [redacted] weeks gestation of pregnancy     PLAN Discharge home in stable condition. Discussed reasons to return to MAU   Follow-up Information    [facility], [facility] Follow up.   Why: Keep scheduled appointment or call office as needed Contact information: [address] [place] [address] [phone]          Allergies as of 02/07/2020      Reactions   Codeine Rash      Medication List    STOP taking these medications   albuterol 108 (90 Base) MCG/ACT inhaler Commonly known as: VENTOLIN HFA   naproxen 500 MG tablet Commonly known as: NAPROSYN     TAKE these medications   multivitamin-prenatal 27-0.8 MG Tabs tablet Take 1 tablet by mouth daily at 12 noon.   progesterone 100 MG vaginal insert Commonly known as: ENDOMETRIN Place 100 mg vaginally at bedtime.        Judeth Horn, NP 02/07/2020  2:46 PM

## 2020-02-07 NOTE — Discharge Instructions (Signed)
Abdominal Pain During Pregnancy  Belly (abdominal) pain is common during pregnancy. There are many possible causes. Most of the time, it is not a serious problem. Other times, it can be a sign that something is wrong with the pregnancy. Always tell your doctor if you have belly pain. Follow these instructions at home:  Do not have sex or put anything in your vagina until your pain goes away completely.  Get plenty of rest until your pain gets better.  Drink enough fluid to keep your pee (urine) pale yellow.  Take over-the-counter and prescription medicines only as told by your doctor.  Keep all follow-up visits as told by your doctor. This is important. Contact a doctor if:  Your pain continues or gets worse after resting.  You have lower belly pain that: ? Comes and goes at regular times. ? Spreads to your back. ? Feels like menstrual cramps.  You have pain or burning when you pee (urinate). Get help right away if:  You have a fever or chills.  You have vaginal bleeding.  You are leaking fluid from your vagina.  You are passing tissue from your vagina.  You throw up (vomit) for more than 24 hours.  You have watery poop (diarrhea) for more than 24 hours.  Your baby is moving less than usual.  You feel very weak or faint.  You have shortness of breath.  You have very bad pain in your upper belly. Summary  Belly (abdominal) pain is common during pregnancy. There are many possible causes.  If you have belly pain during pregnancy, tell your doctor right away.  Keep all follow-up visits as told by your doctor. This is important. This information is not intended to replace advice given to you by your health care provider. Make sure you discuss any questions you have with your health care provider. Document Revised: 01/14/2019 Document Reviewed: 12/29/2016 Elsevier Patient Education  2020 [employer]vier Inc.  

## 2020-02-26 DIAGNOSIS — Z3A11 11 weeks gestation of pregnancy: Secondary | ICD-10-CM | POA: Diagnosis not present

## 2020-02-26 DIAGNOSIS — O210 Mild hyperemesis gravidarum: Secondary | ICD-10-CM | POA: Diagnosis not present

## 2020-02-26 DIAGNOSIS — K920 Hematemesis: Secondary | ICD-10-CM | POA: Diagnosis not present

## 2020-03-04 DIAGNOSIS — O210 Mild hyperemesis gravidarum: Secondary | ICD-10-CM | POA: Diagnosis not present

## 2020-03-04 DIAGNOSIS — Z3480 Encounter for supervision of other normal pregnancy, unspecified trimester: Secondary | ICD-10-CM | POA: Diagnosis not present

## 2020-03-04 DIAGNOSIS — Z3401 Encounter for supervision of normal first pregnancy, first trimester: Secondary | ICD-10-CM | POA: Diagnosis not present

## 2020-03-04 DIAGNOSIS — Z3A12 12 weeks gestation of pregnancy: Secondary | ICD-10-CM | POA: Diagnosis not present

## 2020-03-04 DIAGNOSIS — Z3689 Encounter for other specified antenatal screening: Secondary | ICD-10-CM | POA: Diagnosis not present

## 2020-03-04 DIAGNOSIS — O99611 Diseases of the digestive system complicating pregnancy, first trimester: Secondary | ICD-10-CM | POA: Diagnosis not present

## 2020-03-04 LAB — TOXOPLASMA ANTIBODIES- IGG AND  IGM
Toxoplasma IgG Antibody (EIA): <0.3
Toxoplasma IgM Ab: NEGATIVE

## 2020-03-04 LAB — OB RESULTS CONSOLE RPR: RPR: NONREACTIVE

## 2020-03-04 LAB — OB RESULTS CONSOLE HGB/HCT, BLOOD
HCT: 39 (ref 29–41)
Hemoglobin: 12.9

## 2020-03-04 LAB — OB RESULTS CONSOLE RUBELLA ANTIBODY, IGM: Rubella: IMMUNE

## 2020-03-04 LAB — OB RESULTS CONSOLE HIV ANTIBODY (ROUTINE TESTING): HIV: NONREACTIVE

## 2020-03-04 LAB — OB RESULTS CONSOLE GC/CHLAMYDIA
Chlamydia: NEGATIVE
Gonorrhea: NEGATIVE

## 2020-03-04 LAB — OB RESULTS CONSOLE PLATELET COUNT: Platelets: 289

## 2020-03-04 LAB — OB RESULTS CONSOLE HEPATITIS B SURFACE ANTIGEN: Hepatitis B Surface Ag: NEGATIVE

## 2020-03-04 LAB — SICKLE CELL SCREEN

## 2020-03-11 ENCOUNTER — Other Ambulatory Visit: Payer: Self-pay | Admitting: Obstetrics & Gynecology

## 2020-03-11 ENCOUNTER — Other Ambulatory Visit: Payer: Self-pay

## 2020-03-11 ENCOUNTER — Encounter (HOSPITAL_COMMUNITY): Payer: Self-pay | Admitting: Obstetrics & Gynecology

## 2020-03-11 ENCOUNTER — Inpatient Hospital Stay (HOSPITAL_COMMUNITY)
Admission: AD | Admit: 2020-03-11 | Discharge: 2020-03-11 | Disposition: A | Discharge status: Home / Self Care | Payer: BC Managed Care – PPO | Attending: Obstetrics & Gynecology | Admitting: Obstetrics & Gynecology

## 2020-03-11 DIAGNOSIS — O99511 Diseases of the respiratory system complicating pregnancy, first trimester: Secondary | ICD-10-CM | POA: Diagnosis not present

## 2020-03-11 DIAGNOSIS — E876 Hypokalemia: Secondary | ICD-10-CM | POA: Diagnosis not present

## 2020-03-11 DIAGNOSIS — Z3A13 13 weeks gestation of pregnancy: Secondary | ICD-10-CM | POA: Diagnosis not present

## 2020-03-11 DIAGNOSIS — J45909 Unspecified asthma, uncomplicated: Secondary | ICD-10-CM | POA: Diagnosis not present

## 2020-03-11 DIAGNOSIS — Z885 Allergy status to narcotic agent status: Secondary | ICD-10-CM | POA: Diagnosis not present

## 2020-03-11 DIAGNOSIS — R109 Unspecified abdominal pain: Secondary | ICD-10-CM

## 2020-03-11 DIAGNOSIS — E860 Dehydration: Secondary | ICD-10-CM | POA: Insufficient documentation

## 2020-03-11 DIAGNOSIS — O210 Mild hyperemesis gravidarum: Secondary | ICD-10-CM

## 2020-03-11 DIAGNOSIS — O211 Hyperemesis gravidarum with metabolic disturbance: Secondary | ICD-10-CM

## 2020-03-11 DIAGNOSIS — O26891 Other specified pregnancy related conditions, first trimester: Secondary | ICD-10-CM | POA: Diagnosis not present

## 2020-03-11 LAB — URINALYSIS, ROUTINE W REFLEX MICROSCOPIC
Bilirubin Urine: NEGATIVE
Glucose, UA: NEGATIVE mg/dL
Hgb urine dipstick: NEGATIVE
Ketones, ur: 80 mg/dL — AB
Nitrite: NEGATIVE
Protein, ur: 30 mg/dL — AB
Specific Gravity, Urine: 1.027 (ref 1.005–1.030)
pH: 5 (ref 5.0–8.0)

## 2020-03-11 LAB — COMPREHENSIVE METABOLIC PANEL
ALT: 22 U/L (ref 0–44)
AST: 21 U/L (ref 15–41)
Albumin: 4 g/dL (ref 3.5–5.0)
Alkaline Phosphatase: 51 U/L (ref 38–126)
Anion gap: 14 (ref 5–15)
BUN: 9 mg/dL (ref 6–20)
CO2: 21 mmol/L — ABNORMAL LOW (ref 22–32)
Calcium: 9.8 mg/dL (ref 8.9–10.3)
Chloride: 101 mmol/L (ref 98–111)
Creatinine, Ser: 0.74 mg/dL (ref 0.44–1.00)
GFR calc Af Amer: >60 mL/min (ref >60)
GFR calc non Af Amer: >60 mL/min (ref >60)
Glucose, Bld: 87 mg/dL (ref 70–99)
Potassium: 3.3 mmol/L — ABNORMAL LOW (ref 3.5–5.1)
Sodium: 136 mmol/L (ref 135–145)
Total Bilirubin: 1 mg/dL (ref 0.3–1.2)
Total Protein: 7.5 g/dL (ref 6.5–8.1)

## 2020-03-11 MED ORDER — PROMETHAZINE HCL 25 MG PO TABS: 25.0000 mg | ORAL_TABLET | Freq: Every evening | ORAL | 1 refills | Status: DC | PRN

## 2020-03-11 MED ORDER — SCOPOLAMINE 1 MG/3DAYS TD PT72
1.0000 | MEDICATED_PATCH | TRANSDERMAL | Status: DC
  Administered 2020-03-11: 1.5 mg via TRANSDERMAL
  Filled 2020-03-11: qty 1

## 2020-03-11 MED ORDER — SODIUM CHLORIDE 0.9 % IV SOLN
25.0000 mg | Freq: Once | INTRAVENOUS | Status: AC
  Administered 2020-03-11: 25 mg via INTRAVENOUS
  Filled 2020-03-11: qty 1

## 2020-03-11 MED ORDER — ONDANSETRON 8 MG PO TBDP: 8.0000 mg | ORAL_TABLET | Freq: Three times a day (TID) | ORAL | 0 refills | Status: DC | PRN

## 2020-03-11 MED ORDER — LACTATED RINGERS IV BOLUS
1000.0000 mL | Freq: Once | INTRAVENOUS | Status: AC
  Administered 2020-03-11: 1000 mL via INTRAVENOUS

## 2020-03-11 MED ORDER — FAMOTIDINE IN NACL 20-0.9 MG/50ML-% IV SOLN
20.0000 mg | Freq: Once | INTRAVENOUS | Status: AC
  Administered 2020-03-11: 20 mg via INTRAVENOUS
  Filled 2020-03-11: qty 50

## 2020-03-11 MED ORDER — SODIUM CHLORIDE 0.9 % IV SOLN
8.0000 mg | Freq: Once | INTRAVENOUS | Status: AC
  Administered 2020-03-11: 8 mg via INTRAVENOUS
  Filled 2020-03-11: qty 4

## 2020-03-11 NOTE — MAU Note (Signed)
Patient states she hasn't been able to eat for a couple days, was drinking cranberry juice but it came back up.  No vaginal bleeding. Sharpe pain on the right side.

## 2020-03-11 NOTE — MAU Note (Signed)
Pt reports she has not been able to keep anything down. On reglan without relief. Has sharp pain in right side.

## 2020-03-11 NOTE — Progress Notes (Signed)
Pt. Is currently resting and tolerating sips of ice water. She has not had any emesis episodes in the last hour. She is trying saltine crackers.

## 2020-03-11 NOTE — MAU Provider Note (Signed)
History     CSN: [id number]  Arrival date and time: 03/11/20 6440   First Provider Initiated Contact with Patient 03/11/20 [phone]      Chief Complaint  Patient presents with   Emesis   HPI   Shirley Navarro is a 24 y.o. female G1P0 @ [redacted]w[redacted]d here in MAU with complaints of nausea and vomiting. States she cannot keep anything down. She was taking reglan and stopped because it is no longer working. The last dose she took was Sunday. She was keeping down popsickles,  however now she is not keeping down anything.   OB History    Gravida  1   Para      Term      Preterm      AB      Living        SAB      TAB      Ectopic      Multiple      Live Births              Past Medical History:  Diagnosis Date   Asthma     Past Surgical History:  Procedure Laterality Date   NO PAST SURGERIES      Family History  Problem Relation Age of Onset   Healthy Mother    Healthy Father     Social History   Tobacco Use   Smoking status: Never Smoker   Smokeless tobacco: Never Used  Substance Use Topics   Alcohol use: Not Currently    Comment: Socially   Drug use: Not Currently    Types: Marijuana    Comment: Last smoked  April 2021    Allergies:  Allergies  Allergen Reactions   Codeine Rash    Medications Prior to Admission  Medication Sig Dispense Refill Last Dose   Prenatal Vit-Fe Fumarate-FA (MULTIVITAMIN-PRENATAL) 27-0.8 MG TABS tablet Take 1 tablet by mouth daily at 12 noon.   Past Week at Unknown time   progesterone (ENDOMETRIN) 100 MG vaginal insert Place 100 mg vaginally at bedtime.   Past Week at Unknown time   Results for orders placed or performed during the hospital encounter of 03/11/20 (from the past 48 hour(s))  Urinalysis, Routine w reflex microscopic     Status: Abnormal   Collection Time: 03/11/20  8:36 AM  Result Value Ref Range   Color, Urine YELLOW YELLOW   APPearance HAZY (A) CLEAR   Specific Gravity, Urine 1.027  1.005 - 1.030   pH 5.0 5.0 - 8.0   Glucose, UA NEGATIVE NEGATIVE mg/dL   Hgb urine dipstick NEGATIVE NEGATIVE   Bilirubin Urine NEGATIVE NEGATIVE   Ketones, ur 80 (A) NEGATIVE mg/dL   Protein, ur 30 (A) NEGATIVE mg/dL   Nitrite NEGATIVE NEGATIVE   Leukocytes,Ua TRACE (A) NEGATIVE   RBC / HPF 0-5 0 - 5 RBC/hpf   WBC, UA 6-10 0 - 5 WBC/hpf   Bacteria, UA FEW (A) NONE SEEN   Squamous Epithelial / LPF 6-10 0 - 5   Mucus PRESENT     Comment: Performed at [facility], [address], [place], [place] [address]  Comprehensive metabolic panel     Status: Abnormal   Collection Time: 03/11/20 10:15 AM  Result Value Ref Range   Sodium 136 135 - 145 mmol/L   Potassium 3.3 (L) 3.5 - 5.1 mmol/L   Chloride 101 98 - 111 mmol/L   CO2 21 (L) 22 - 32 mmol/L   Glucose,  Bld 87 70 - 99 mg/dL    Comment: Glucose reference range applies only to samples taken after fasting for at least 8 hours.   BUN 9 6 - 20 mg/dL   Creatinine, Ser 0.74 0.44 - 1.00 mg/dL   Calcium 9.8 8.9 - 10.3 mg/dL   Total Protein 7.5 6.5 - 8.1 g/dL   Albumin 4.0 3.5 - 5.0 g/dL   AST 21 15 - 41 U/L   ALT 22 0 - 44 U/L   Alkaline Phosphatase 51 38 - 126 U/L   Total Bilirubin 1.0 0.3 - 1.2 mg/dL   GFR calc non Af Amer >60 >60 mL/min   GFR calc Af Amer >60 >60 mL/min   Anion gap 14 5 - 15    Comment: Performed at [facility], [place] [address] [place], [place] [address]   Review of Systems  Gastrointestinal: Positive for nausea and vomiting. Negative for abdominal pain (None now, has pain with vomiting ).  Genitourinary: Negative for vaginal bleeding and vaginal discharge.   Physical Exam   Blood pressure 128/74, pulse 99, temperature 98.2 F (36.8 C), temperature source Oral, resp. rate 18, height 5\' 5"  (1.651 m), weight 59.9 kg, last menstrual period 12/11/2019, SpO2 100 %.  Physical Exam  Constitutional: She is oriented to person, place, and time. She appears well-developed and well-nourished. She has a sickly  appearance.  Respiratory: Effort normal.  GI: Soft. She exhibits no distension. There is no abdominal tenderness. There is no rebound and no guarding.  Musculoskeletal:        General: Normal range of motion.  Neurological: She is alert and oriented to person, place, and time.  Skin: Skin is warm.  Psychiatric: Her behavior is normal.   MAU Course  Procedures  None  MDM  + fetal heart tones via doppler  Urine shows severe dehydration LR bolus x 1 LR bolus with 25 mg of phenergan given IV Zofran IV & Pepcid IV Scope patch placed Patient tolerating oral fluids and crackers without vomiting.   Assessment and Plan   A:  1. Hyperemesis complicating pregnancy, antepartum   2. Severe dehydration   3. Hypokalemia   4. Abdominal pain in pregnancy, first trimester     P:  Discharge home in stable condition Rx: Zofran with Good Rx coupon, Phenergan at night Pepcid OTC as directed on the bottle Bland/ BRAT diet Increase potassium in diet Return to MAU if symptoms worsen Keep next appt with OB   Savahna Casados, Artist Pais, NP 03/11/2020 3:56 PM

## 2020-03-11 NOTE — Discharge Instructions (Signed)
Hyperemesis Gravidarum Hyperemesis gravidarum is a severe form of nausea and vomiting that happens during pregnancy. Hyperemesis is worse than morning sickness. It may cause you to have nausea or vomiting all day for many days. It may keep you from eating and drinking enough food and liquids, which can lead to dehydration, malnutrition, and weight loss. Hyperemesis usually occurs during the first half (the first 20 weeks) of pregnancy. It often goes away once a woman is in her second half of pregnancy. However, sometimes hyperemesis continues through an entire pregnancy. What are the causes? The cause of this condition is not known. It may be related to changes in chemicals (hormones) in the body during pregnancy, such as the high level of pregnancy hormone (human chorionic gonadotropin) or the increase in the female sex hormone (estrogen). What are the signs or symptoms? Symptoms of this condition include:  Nausea that does not go away.  Vomiting that does not allow you to keep any food down.  Weight loss.  Body fluid loss (dehydration).  Having no desire to eat, or not liking food that you have previously enjoyed. How is this diagnosed? This condition may be diagnosed based on:  A physical exam.  Your medical history.  Your symptoms.  Blood tests.  Urine tests. How is this treated? This condition is managed by controlling symptoms. This may include:  Following an eating plan. This can help lessen nausea and vomiting.  Taking prescription medicines. An eating plan and medicines are often used together to help control symptoms. If medicines do not help relieve nausea and vomiting, you may need to receive fluids through an IV at the hospital. Follow these instructions at home: Eating and drinking   Avoid the following: ? Drinking fluids with meals. Try not to drink anything during the 30 minutes before and after your meals. ? Drinking more than 1 cup of fluid at a  time. ? Eating foods that trigger your symptoms. These may include spicy foods, coffee, high-fat foods, very sweet foods, and acidic foods. ? Skipping meals. Nausea can be more intense on an empty stomach. If you cannot tolerate food, do not force it. Try sucking on ice chips or other frozen items and make up for missed calories later. ? Lying down within 2 hours after eating. ? Being exposed to environmental triggers. These may include food smells, smoky rooms, closed spaces, rooms with strong smells, warm or humid places, overly loud and noisy rooms, and rooms with motion or flickering lights. Try eating meals in a well-ventilated area that is free of strong smells. ? Quick and sudden changes in your movement. ? Taking iron pills and multivitamins that contain iron. If you take prescription iron pills, do not stop taking them unless your health care provider approves. ? Preparing food. The smell of food can spoil your appetite or trigger nausea.  To help relieve your symptoms: ? Listen to your body. Everyone is different and has different preferences. Find what works best for you. ? Eat and drink slowly. ? Eat 5-6 small meals daily instead of 3 large meals. Eating small meals and snacks can help you avoid an empty stomach. ? In the morning, before getting out of bed, eat a couple of crackers to avoid moving around on an empty stomach. ? Try eating starchy foods as these are usually tolerated well. Examples include cereal, toast, bread, potatoes, pasta, rice, and pretzels. ? Include at least 1 serving of protein with your meals and snacks. Protein options include   lean meats, poultry, seafood, beans, nuts, nut butters, eggs, cheese, and yogurt. ? Try eating a protein-rich snack before bed. Examples of a protein-rick snack include cheese and crackers or a peanut butter sandwich made with 1 slice of whole-wheat bread and 1 tsp (5 g) of peanut butter. ? Eat or suck on things that have ginger in them.  It may help relieve nausea. Add  tsp ground ginger to hot tea or choose ginger tea. ? Try drinking 100% fruit juice or an electrolyte drink. An electrolyte drink contains sodium, potassium, and chloride. ? Drink fluids that are cold, clear, and carbonated or sour. Examples include lemonade, ginger ale, lemon-lime soda, ice water, and sparkling water. ? Brush your teeth or use a mouth rinse after meals. ? Talk with your health care provider about starting a supplement of vitamin B6. General instructions  Take over-the-counter and prescription medicines only as told by your health care provider.  Follow instructions from your health care provider about eating or drinking restrictions.  Continue to take your prenatal vitamins as told by your health care provider. If you are having trouble taking your prenatal vitamins, talk with your health care provider about different options.  Keep all follow-up and pre-birth (prenatal) visits as told by your health care provider. This is important. Contact a health care provider if:  You have pain in your abdomen.  You have a severe headache.  You have vision problems.  You are losing weight.  You feel weak or dizzy. Get help right away if:  You cannot drink fluids without vomiting.  You vomit blood.  You have constant nausea and vomiting.  You are very weak.  You faint.  You have a fever and your symptoms suddenly get worse. Summary  Hyperemesis gravidarum is a severe form of nausea and vomiting that happens during pregnancy.  Making some changes to your eating habits may help relieve nausea and vomiting.  This condition may be managed with medicine.  If medicines do not help relieve nausea and vomiting, you may need to receive fluids through an IV at the hospital. This information is not intended to replace advice given to you by your health care provider. Make sure you discuss any questions you have with your health care  provider. Document Revised: 10/16/2017 Document Reviewed: 05/25/2016 Elsevier Patient Education  2020 [employer]  

## 2020-03-11 NOTE — Progress Notes (Signed)
Patients mother is accompanying her

## 2020-03-25 DIAGNOSIS — Z361 Encounter for antenatal screening for raised alphafetoprotein level: Secondary | ICD-10-CM | POA: Diagnosis not present

## 2020-03-25 LAB — AFP, SERUM, OPEN SPINA BIFIDA: AFP Quad Interp: NEGATIVE

## 2020-04-24 DIAGNOSIS — Z363 Encounter for antenatal screening for malformations: Secondary | ICD-10-CM | POA: Diagnosis not present

## 2020-04-24 DIAGNOSIS — Z3402 Encounter for supervision of normal first pregnancy, second trimester: Secondary | ICD-10-CM | POA: Diagnosis not present

## 2020-04-24 DIAGNOSIS — Z3480 Encounter for supervision of other normal pregnancy, unspecified trimester: Secondary | ICD-10-CM | POA: Diagnosis not present

## 2020-05-22 ENCOUNTER — Encounter: Payer: Self-pay | Admitting: General Practice

## 2020-06-01 ENCOUNTER — Other Ambulatory Visit: Payer: Self-pay | Admitting: *Deleted

## 2020-06-01 ENCOUNTER — Encounter: Payer: Self-pay | Admitting: Certified Nurse Midwife

## 2020-06-01 DIAGNOSIS — Z3400 Encounter for supervision of normal first pregnancy, unspecified trimester: Secondary | ICD-10-CM | POA: Insufficient documentation

## 2020-06-12 ENCOUNTER — Ambulatory Visit (INDEPENDENT_AMBULATORY_CARE_PROVIDER_SITE_OTHER): Payer: Medicaid Other | Admitting: Certified Nurse Midwife

## 2020-06-12 ENCOUNTER — Encounter: Payer: Self-pay | Admitting: General Practice

## 2020-06-12 ENCOUNTER — Other Ambulatory Visit: Payer: Self-pay

## 2020-06-12 ENCOUNTER — Encounter: Payer: Self-pay | Admitting: Certified Nurse Midwife

## 2020-06-12 VITALS — BP 114/72 | HR 88 | Temp 98.8°F | Wt 166.2 lb

## 2020-06-12 DIAGNOSIS — Z3A26 26 weeks gestation of pregnancy: Secondary | ICD-10-CM

## 2020-06-12 DIAGNOSIS — Z3400 Encounter for supervision of normal first pregnancy, unspecified trimester: Secondary | ICD-10-CM | POA: Diagnosis not present

## 2020-06-12 NOTE — Progress Notes (Signed)
   PRENATAL VISIT NOTE  Subjective:  Shirley Navarro is a 24 y.o. G1P0 at [redacted]w[redacted]d being seen today for her first prenatal visit for this pregnancy.  She is currently monitored for the following issues for this low-risk pregnancy and has Supervision of normal first pregnancy, antepartum on their problem list.  Patient reports no complaints. She had hyperemesis in her first trimester which has since resolved. Verbalized feeling nervous about switching providers so far into her pregnancy, but had to for insurance purposes.     Contractions: Not present. Vag. Bleeding: None.  Movement: Present. Denies leaking of fluid.   She is planning to breast and formula feed. Desires the patch for contraception.  The following portions of the patient's history were reviewed and updated as appropriate: allergies, current medications, past family history, past medical history, past social history, past surgical history and problem list.   Objective:   Vitals:   06/12/20 0935  BP: 114/72  Pulse: 88  Temp: 98.8 F (37.1 C)  Weight: 166 lb 3.2 oz (75.4 kg)    Fetal Status: Fetal Heart Rate (bpm): 140 Fundal Height: 26 cm Movement: Present     General:  Alert, oriented and cooperative. Patient is in no acute distress.  Skin: Skin is warm and dry. No rash noted.   Cardiovascular: Normal heart rate and rhythm noted  Respiratory: Normal respiratory effort, no problems with respiration noted. Clear to auscultation.   Abdomen: Soft, gravid, appropriate for gestational age. Normal bowel sounds. Non-tender. Pain/Pressure: Absent     Pelvic: Cervical exam deferred       Normal cervical contour, no lesions, no bleeding following pap, normal discharge  Extremities: Normal range of motion.  Edema: None  Mental Status: Normal mood and affect. Normal behavior. Normal judgment and thought content.   Assessment and Plan:  Pregnancy: G1P0 at [redacted]w[redacted]d 1. Supervision of normal first pregnancy, antepartum - Genetic  Screening - Will repeat PHQ-9 at next visit - gave general reassurance about switching and emphasized benefits of care at [facility], pt verbalized feeling less anxious about switch. - Discussed need for OB urine culture and growth scan in 3rd trimester due to sickle cell trait. U/S order placed.  2. [redacted] weeks gestation of pregnancy - Discussed the normal visit cadence for prenatal care - Discussed the nature of our practice with multiple providers including residents and students    Preterm labor/first trimester warning symptoms and general obstetric precautions including but not limited to vaginal bleeding, contractions, leaking of fluid and fetal movement were reviewed in detail with the patient. Please refer to After Visit Summary for other counseling recommendations.   Return in about 2 weeks (around 06/26/2020) for IN-PERSON, LOB w GTT.  Future Appointments  Date Time Provider Department Center  06/24/2020  8:10 AM Sharyon Cable, CNM [facility] None   Edd Arbour, CNM, MSN, [facility] 06/12/20 12:06 PM

## 2020-06-19 ENCOUNTER — Encounter: Payer: Self-pay | Admitting: General Practice

## 2020-06-22 ENCOUNTER — Other Ambulatory Visit: Payer: Self-pay | Admitting: Certified Nurse Midwife

## 2020-06-22 DIAGNOSIS — Z3A27 27 weeks gestation of pregnancy: Secondary | ICD-10-CM

## 2020-06-22 NOTE — Progress Notes (Unsigned)
Needs [place] MFM OB Comp +14wks for growth scan due to sickle cell trait

## 2020-06-24 ENCOUNTER — Ambulatory Visit (INDEPENDENT_AMBULATORY_CARE_PROVIDER_SITE_OTHER): Payer: Medicaid Other | Admitting: Certified Nurse Midwife

## 2020-06-24 ENCOUNTER — Encounter: Payer: Self-pay | Admitting: Certified Nurse Midwife

## 2020-06-24 ENCOUNTER — Other Ambulatory Visit: Payer: Self-pay

## 2020-06-24 VITALS — BP 112/74 | HR 82

## 2020-06-24 DIAGNOSIS — O99019 Anemia complicating pregnancy, unspecified trimester: Secondary | ICD-10-CM

## 2020-06-24 DIAGNOSIS — Z23 Encounter for immunization: Secondary | ICD-10-CM | POA: Diagnosis not present

## 2020-06-24 DIAGNOSIS — D573 Sickle-cell trait: Secondary | ICD-10-CM

## 2020-06-24 DIAGNOSIS — Z3402 Encounter for supervision of normal first pregnancy, second trimester: Secondary | ICD-10-CM

## 2020-06-24 DIAGNOSIS — Z3400 Encounter for supervision of normal first pregnancy, unspecified trimester: Secondary | ICD-10-CM

## 2020-06-24 DIAGNOSIS — Z3A28 28 weeks gestation of pregnancy: Secondary | ICD-10-CM

## 2020-06-24 HISTORY — DX: Anemia complicating pregnancy, unspecified trimester: D57.3

## 2020-06-24 MED ORDER — CITRANATAL ASSURE 35-1 & 300 MG PO MISC: 1.0000 | Freq: Every day | ORAL | 4 refills | Status: DC

## 2020-06-24 NOTE — Patient Instructions (Signed)

## 2020-06-24 NOTE — Progress Notes (Signed)
-  tdap today

## 2020-06-24 NOTE — Progress Notes (Signed)
   PRENATAL VISIT NOTE  Subjective:  Shirley Navarro is a 24 y.o. G1P0 at [redacted]w[redacted]d being seen today for ongoing prenatal care.  She is currently monitored for the following issues for this low-risk pregnancy and has Supervision of normal first pregnancy, antepartum and Sickle cell trait in mother affecting pregnancy [facility]) on their problem list.  Patient reports no complaints.  Contractions: Not present.  .  Movement: Present. Denies leaking of fluid.   The following portions of the patient's history were reviewed and updated as appropriate: allergies, current medications, past family history, past medical history, past social history, past surgical history and problem list.   Objective:   Vitals:   06/24/20 0820  BP: 112/74  Pulse: 82    Fetal Status: Fetal Heart Rate (bpm): 140 Fundal Height: 28 cm Movement: Present     General:  Alert, oriented and cooperative. Patient is in no acute distress.  Skin: Skin is warm and dry. No rash noted.   Cardiovascular: Normal heart rate noted  Respiratory: Normal respiratory effort, no problems with respiration noted  Abdomen: Soft, gravid, appropriate for gestational age.  Pain/Pressure: Absent     Pelvic: Cervical exam deferred        Extremities: Normal range of motion.     Mental Status: Normal mood and affect. Normal behavior. Normal judgment and thought content.   Assessment and Plan:  Pregnancy: G1P0 at [redacted]w[redacted]d 1. Encounter for supervision of normal first pregnancy in second trimester - Patient doing well, no complaints  - routine prenatal care  - discussed with patient results of GTT and what abnormal results mean  - anticipatory guidance on upcoming appointments  - [facility] - HIV Antibody (routine testing w rflx) - CBC - Glucose Tolerance, 2 Hours w/1 Hour - Prenat w/o [id number] (CITRANATAL ASSURE) 35-1 & 300 MG tablet; Take 1 tablet by mouth daily.  Dispense: 60 each; Refill: 4  2. [redacted] weeks gestation of pregnancy - [facility] - HIV  Antibody (routine testing w rflx) - CBC - Glucose Tolerance, 2 Hours w/1 Hour  3. Sickle cell trait in mother affecting pregnancy [facility]) - patient has growth ultrasound scheduled for 9/22 - discussed with patient urine culture needed today for Peculiar trait  - Culture, OB Urine  Preterm labor symptoms and general obstetric precautions including but not limited to vaginal bleeding, contractions, leaking of fluid and fetal movement were reviewed in detail with the patient. Please refer to After Visit Summary for other counseling recommendations.   Return in about 2 weeks (around 07/08/2020) for ROB-mychart .  Future Appointments  Date Time Provider Department Center  07/01/2020  1:45 PM [facility] US5 [facility]  07/10/2020 10:10 AM Armando Reichert, CNM [facility] None    Sharyon Cable, CNM

## 2020-06-25 ENCOUNTER — Encounter: Payer: Self-pay | Admitting: General Practice

## 2020-06-25 LAB — CBC
Hematocrit: 33.4 % — ABNORMAL LOW (ref 34.0–46.6)
Hemoglobin: 10.9 g/dL — ABNORMAL LOW (ref 11.1–15.9)
MCH: 27 pg (ref 26.6–33.0)
MCHC: 32.6 g/dL (ref 31.5–35.7)
MCV: 83 fL (ref 79–97)
Platelets: 184 10*3/uL (ref 150–450)
RBC: 4.03 x10E6/uL (ref 3.77–5.28)
RDW: 14.1 % (ref 11.7–15.4)
WBC: 8.8 10*3/uL (ref 3.4–10.8)

## 2020-06-25 LAB — GLUCOSE TOLERANCE, 2 HOURS W/ 1HR
Glucose, 1 hour: 122 mg/dL (ref 65–179)
Glucose, 2 hour: 104 mg/dL (ref 65–152)
Glucose, Fasting: 77 mg/dL (ref 65–91)

## 2020-06-25 LAB — HIV ANTIBODY (ROUTINE TESTING W REFLEX): HIV Screen 4th Generation wRfx: NONREACTIVE

## 2020-06-25 LAB — RPR: RPR Ser Ql: NONREACTIVE

## 2020-06-26 LAB — URINE CULTURE, OB REFLEX

## 2020-06-26 LAB — CULTURE, OB URINE

## 2020-07-01 ENCOUNTER — Other Ambulatory Visit: Payer: Self-pay

## 2020-07-01 ENCOUNTER — Ambulatory Visit: Payer: Medicaid Other | Attending: Certified Nurse Midwife

## 2020-07-01 DIAGNOSIS — Z148 Genetic carrier of other disease: Secondary | ICD-10-CM | POA: Diagnosis not present

## 2020-07-01 DIAGNOSIS — Z862 Personal history of diseases of the blood and blood-forming organs and certain disorders involving the immune mechanism: Secondary | ICD-10-CM | POA: Insufficient documentation

## 2020-07-01 DIAGNOSIS — Z363 Encounter for antenatal screening for malformations: Secondary | ICD-10-CM | POA: Diagnosis not present

## 2020-07-01 DIAGNOSIS — Z3A27 27 weeks gestation of pregnancy: Secondary | ICD-10-CM | POA: Insufficient documentation

## 2020-07-02 ENCOUNTER — Other Ambulatory Visit: Payer: Self-pay | Admitting: *Deleted

## 2020-07-02 DIAGNOSIS — D573 Sickle-cell trait: Secondary | ICD-10-CM

## 2020-07-02 DIAGNOSIS — O99019 Anemia complicating pregnancy, unspecified trimester: Secondary | ICD-10-CM

## 2020-07-10 ENCOUNTER — Encounter: Payer: Self-pay | Admitting: Advanced Practice Midwife

## 2020-07-10 ENCOUNTER — Other Ambulatory Visit: Payer: Self-pay | Admitting: Advanced Practice Midwife

## 2020-07-10 ENCOUNTER — Ambulatory Visit (INDEPENDENT_AMBULATORY_CARE_PROVIDER_SITE_OTHER): Payer: Medicaid Other | Admitting: Advanced Practice Midwife

## 2020-07-10 ENCOUNTER — Other Ambulatory Visit: Payer: Self-pay

## 2020-07-10 VITALS — BP 120/69 | HR 92 | Temp 97.7°F | Wt 177.2 lb

## 2020-07-10 DIAGNOSIS — Z3A30 30 weeks gestation of pregnancy: Secondary | ICD-10-CM

## 2020-07-10 DIAGNOSIS — Z3400 Encounter for supervision of normal first pregnancy, unspecified trimester: Secondary | ICD-10-CM

## 2020-07-10 NOTE — Patient Instructions (Addendum)
COVID-19 Vaccination if You Are Pregnant or Breastfeeding ° °The Society for Maternal-Fetal Medicine (SMFM) and other pregnancy experts recommend that pregnant and lactating people be vaccinated against COVID-19. The Centers for Disease Control and Prevention (CDC) also recommend vaccination for “all people aged 24 years and older, including people who are pregnant, breastfeeding, trying to get pregnant now, or might become pregnant in the future.” Vaccination is the best way to reduce the risks of COVID-19 infection and COVID-related complications for both you and your baby. ° °Three vaccines are available to prevent COVID-19: °• The two-dose Pfizer vaccine for people 12 years and older--APPROVED by the [place] Food and Drug Administration on June 01, 2020 °• The two-dose Moderna vaccine for people 18 years and older--AUTHORIZED for emergency use °• The one-dose Johnson & Johnson vaccine for people 18 years and older (you may also see this vaccine referred to as the “Janssen vaccine”)--AUTHORIZED for emergency use ° °For those receiving the Pfizer and Moderna vaccines, the second dose is given 21 days (Pfizer) and 28 days (Moderna) after the first dose. The Johnson & Johnson vaccine is only one dose. ° °Information for Pregnant Individuals °If you are pregnant or planning to become pregnant and are thinking about getting vaccinated, consider talking with your health care professional about the vaccine.  ° °To help with your decision, you should consider the following key points: °Anyone can get the COVID vaccines free of charge regardless of immigration status or whether they have insurance. You may be asked for your social security number, but it is  °NOT required to get vaccinated. ° °What are benefits of getting the COVID-19 vaccines during pregnancy?  °• The vaccines can help protect you from getting COVID-19. With the two-dose vaccines, you must get both doses for maximum effectiveness. It’s not yet known how  long protection lasts. ° °• Another potential benefit is that getting the vaccine while pregnant may help you pass antiCOVID-19 antibodies to your baby. In numerous studies of vaccinated moms, antibodies were found in the umbilical cord blood of babies and in the mother’s breastmilk. ° °• The CDC, along with other federal partners, are monitoring people who have been vaccinated for serious side effects. So far, more than 139,000 pregnant people have been °vaccinated. No unexpected pregnancy or fetal problems have occurred. There have been no reports of any increased risk of pregnancy loss, growth problems, or birth defects. ° °• A safe vaccine is generally considered one in which the benefits of being vaccinated outweigh the risks. The current vaccines are not live vaccines. There is only a very small  °chance that they cross the placenta, so it’s unlikely that they even reach the fetus. Vaccines don’t affect future fertility. The only people who should NOT get vaccinated are those who have had a severe allergic reaction to vaccines in the past or any vaccine ingredients. ° °• Side effects may occur in the first 3 days after getting vaccinated.1 These include mild to moderate fever, headache, and muscle aches. Side effects may be worse after the second dose of the Pfizer and Moderna vaccines. Fever should be avoided during pregnancy,especially in the first trimester. Those who develop a fever after vaccination can take °acetaminophen (Tylenol). This medication is safe to use during pregnancy and does not  affect how the vaccine works.  ° °What are the known risks of getting COVID-19 during pregnancy?  °About 1 to 3 per 1,000 pregnant women with COVID-19 will develop severe disease. Compared with those who   aren’t pregnant, pregnant people infected by the COVID-19 virus: °• Are 3 times more likely to need ICU care °• Are 2 to 3 times more likely to need advanced life support and a breathing tube  °• Have a small  increased risk of dying due to COVID-19 °They may also be at increased risk of stillbirth and preterm birth. ° °What is my risk of getting COVID-19?  °Your risk of getting COVID-19 depends on the chance that you will come into contact with another infected person. The risk may be higher if you live in a community where there is a lot of COVID-19 infection or work in healthcare or another high-contact setting.  ° °What is my risk for severe complications if I get COVID-19?  °Data show that older pregnant women; those with preexisting health conditions, such as a body mass index higher than 35 kg/m2, diabetes, and heart disorders; and Black or Latinx women have an especially increased risk of severe disease and death from COVID-19. °  °If you still have questions about the vaccines or need more information, ask your health care provider or go to the Centers for Disease Control and Prevention’s COVID-19 vaccine webpage.  ° °An Update on the Johnson & Johnson Vaccine  ° °In April 2021, the FDA and CDC called for a brief pause to use of the Johnson & Johnson vaccine. They did so after reports of a severe side effect in a very small number of women younger than age 50 following vaccination. This side effect, called thrombosis with thrombocytopenia syndrome (TTS), causes blood clots (thrombosis) combined with low levels of platelets (thrombocytopenia). ° °TTS following the Johnson & Johnson vaccine is extremely rare. At the time of this update, it has occurred in only 7 people per 1 million Johnson & Johnson shots given. According to the CDC, being on hormonal birth control (the pill, patch, or ring), pregnancy, breastfeeding, or being recently pregnant does not make you more likely to develop TTS after getting the Johnson & Johnson vaccine. The pause was lifted on January 31, 2020, after the FDA and CDC determined that the known benefits of the Johnson & Johnson vaccine far outweigh the risks.  ° °Health care professionals  have been alerted to the possibility of this side effect in people who have received the Johnson & Johnson vaccine. National organizations continue to recommend COVID-19 vaccination with any of the vaccines for pregnant women. All women younger than age 50 years, whether pregnant, breastfeeding, or not, should be aware of the very rare risk of TTS after getting the Johnson & Johnson vaccine. The Pfizer and Moderna vaccines don’t have this risk. If you get the Johnson & Johnson vaccine,  °seek medical help right away if you develop any of the following symptoms within 3 weeks of getting your shot: ° °• Severe or persistent headaches or blurred vision °• Shortness of breath °• Chest pain °• Leg swelling °• Persistent abdominal pain °• Easy bruising or tiny blood spots under the skin beyond the injection site ° °Experts continue to collect health and safety information from pregnant people who have been vaccinated. If you have questions about vaccination during pregnancy, visit the CDC website or talk to your health care professional. Information for Breastfeeding/Lactating Individuals The Society for Maternal-Fetal Medicine and other pregnancy experts recommend COVID-19 vaccination for people who are breastfeeding/lactating. You don’t have to delay or stop  °breastfeeding just because you get vaccinated.  ° °Getting Vaccinated  °You can get vaccinated at   any time during pregnancy. The CDC is committed to monitoring the vaccine's safety for all individuals. Your health professional or vaccine clinic may give you information about enrolling in the v-safe after vaccination health checker (see the box below).Even after you're fully vaccinated, it is important to follow the CDC's guidance for wearing a mask indoors in areas where there are substantial or high rates of COVID-19 infection.   What Happens When You Enroll in v-Safe?  The v-safe after vaccination health checker program lets the CDC check in with you after  your vaccination. At sign-up, you can indicate that you are pregnant. Once you do that, expect the following: . Someone may call you from the v-safe program to ask initial questions and get more information. . You may be asked to enroll in the vaccine pregnancy registry, which is collecting information about any effects of the vaccine during pregnancy. This is a great way to help scientists monitor the vaccine's safety and effectiveness.   References 1. [address], [place] Lo M, Wallace M, Curran KG, Chamberland M, et al. The Advisory Committee on [employer]' Interim Recommendation for Use of Pfizer-BioNTech  COVID-19 Vaccine -- [place], December 2020. MMWR Morbidity and Mortality Weekly Report 2020;69. 2. FDA Briefing Document. Janssen Ad26.COV2.S Vaccine for the Prevention of COVID-19. 2021. Accessed Dec 13, 2019; Available from: FemaleHumor.es 3. PFIZER-BIONTECH COVID-19 VACCINE [package insert] [place]: Pfizer and [place], [place]: Biontech;2020. 4. FDA Briefing Document. Moderna COVID-19 Vaccine. 2020. Accessed 2020, Dec 18; Available from: DateTunes.nl  5. Bea Laura, Bordt EA, Atyeo C, Deriso E, Akinwunmi B, Young N, et al. COVID-19 vaccine response in pregnant and lactating women: a cohort study. Am J Obstet Gynecol 2021 Mar 24. 6. Panagiotakopoulos Elbert Ewings, Omer Jack, Lipkind HS, Caprice Renshaw DS, et al. SARS-CoV-2 Infection Among Hospitalized Pregnant Women: Reasons for Admission and Pregnancy  Characteristics - [facility], March 1-Mar 09, 2019. MMWR Morb Baxter Flattery Rep 2020 Sep 23;69(38):1355-9. 7. Zambrano LD, [place], Strid P, [place], Baldo Ash [place], et al. Update: Characteristics of Symptomatic Women of Reproductive Age with Laboratory-Confirmed SARSCoV-2 Infection by Pregnancy Status - [place] States, January 22-July 13, 2019. MMWR Morb Baxter Flattery Rep 2020 Nov  6;69(44):1641-7. 8. Delahoy MJ, [place] Duff, Weston Settle PD, Blima Ledger, et al. Characteristics and Maternal and Birth Outcomes of Hospitalized Pregnant Women with Laboratory-Confirmed COVID-19 - COVID-NET, 39 States, March 1-June 01, 2019. MMWR Morb Baxter Flattery Rep 2020 Sep 25;69(38):1347-54.  Considering Waterbirth? Guide for patients at [facility] [employer] Why consider waterbirth? . Gentle birth for babies  . Less pain medicine used in labor  . May allow for passive descent/less pushing  . May reduce perineal tears  . More mobility and instinctive maternal position changes  . Increased maternal relaxation  . Reduced blood pressure in labor   Is waterbirth safe? What are the risks of infection, drowning or other complications? . Infection:  Marland Kitchen Very low risk (3.7 % for tub vs 4.8% for bed)  . 7 in 8000 waterbirths with documented infection  . Poorly cleaned equipment most common cause  . Slightly lower group B strep transmission rate  . Drowning  . Maternal:  . Very low risk  . Related to seizures or fainting  . Newborn:  Marland Kitchen Very low risk. No evidence of increased risk of respiratory problems in multiple large studies  . Physiological protection from breathing under water  . Avoid underwater birth if  if there are any fetal complications  . Once baby's head is out of the water, keep it out.  . Birth complication  . Some reports of cord trauma, but risk decreased by bringing baby to surface gradually  . No evidence of increased risk of shoulder dystocia. Mothers can usually change positions faster in water than in a bed, possibly aiding the maneuvers to free the shoulder.  ? You must attend a Waterbirth class at [facility] . 3rd Wednesday of every month from 7-9pm  . Free  . Register by calling [phone] or online at [email or web address]  . Bring us the certificate from the class to your prenatal appointment  Meet with a  midwife at 36 weeks to see if you can still plan a waterbirth and to sign the consent.   If you plan a waterbirth at [facility], you can opt to purchase the following: . Fish Net . Bathing suit top (optional)  . Long-handled mirror (optional)  .  Things that would prevent you from having a waterbirth: . Unknown or Positive COVID-19 diagnosis upon admission to hospital  . Premature, <37wks  . Previous cesarean birth  . Presence of thick meconium-stained fluid  . Multiple gestation (Twins, triplets, etc.)  . Uncontrolled diabetes or gestational diabetes requiring medication  . Hypertension requiring medication or diagnosis of pre-eclampsia  . Heavy vaginal bleeding  . Non-reassuring fetal heart rate  . Active infection (MRSA, etc.). Group B Strep is NOT a contraindication for waterbirth.  . If your labor has to be induced and induction method requires continuous monitoring of the baby's heart rate  . Other risks/issues identified by your obstetrical provider  Please remember that birth is unpredictable. Under certain unforeseeable circumstances your provider may advise against giving birth in the tub. These decisions will be made on a case-by-case basis and with the safety of you and your baby as our highest priority.  **Please remember that in order to have a waterbirth, you must test Negative to COVID-19 upon admission to the hospital.**  

## 2020-07-10 NOTE — Progress Notes (Signed)
   PRENATAL VISIT NOTE  Subjective:  Shirley Navarro is a 24 y.o. G1P0 at [redacted]w[redacted]d being seen today for ongoing prenatal care.  She is currently monitored for the following issues for this low-risk pregnancy and has Supervision of normal first pregnancy, antepartum and Sickle cell trait in mother affecting pregnancy [facility]) on their problem list.  Patient reports no complaints.  Contractions: Not present. Vag. Bleeding: None.  Movement: Present. Denies leaking of fluid.   The following portions of the patient's history were reviewed and updated as appropriate: allergies, current medications, past family history, past medical history, past social history, past surgical history and problem list.   Objective:   Vitals:   07/10/20 1006  BP: 120/69  Pulse: 92  Temp: 97.7 F (36.5 C)  Weight: 177 lb 3.2 oz (80.4 kg)    Fetal Status: Fetal Heart Rate (bpm): 146   Movement: Present     General:  Alert, oriented and cooperative. Patient is in no acute distress.  Skin: Skin is warm and dry. No rash noted.   Cardiovascular: Normal heart rate noted  Respiratory: Normal respiratory effort, no problems with respiration noted  Abdomen: Soft, gravid, appropriate for gestational age.  Pain/Pressure: Present     Pelvic: Cervical exam deferred        Extremities: Normal range of motion.  Edema: None  Mental Status: Normal mood and affect. Normal behavior. Normal judgment and thought content.   Assessment and Plan:  Pregnancy: G1P0 at [redacted]w[redacted]d 1. Supervision of normal first pregnancy, antepartum - No ABO/RH on file in epic or in prenatal records sent from [facility] [employer]  - ABO/Rh; Future  2. [redacted] weeks gestation of pregnancy - Planning covid vaccine for after pregnancy, recommended that she get it now during pregnancy.  SMFM handout given   Preterm labor symptoms and general obstetric precautions including but not limited to vaginal bleeding, contractions, leaking of fluid and fetal movement were reviewed in  detail with the patient. Please refer to After Visit Summary for other counseling recommendations.   Return in about 2 weeks (around 07/24/2020).  Future Appointments  Date Time Provider Department Center  07/31/2020  1:45 PM [facility] NURSE [facility]  07/31/2020  2:00 PM [facility]    Thressa Sheller DNP, CNM  07/10/20  10:27 AM

## 2020-07-11 LAB — ABO/RH: Rh Factor: POSITIVE

## 2020-07-20 ENCOUNTER — Telehealth: Payer: Self-pay | Admitting: *Deleted

## 2020-07-20 NOTE — Telephone Encounter (Signed)
Patient called stating she is having pelvic pain. Patient is [redacted]w[redacted]d today. Advised patient that she could be expressing Braxton Hicks contractions. Patient denies leaking of fluid, vaginal discharge and bleeding. Advised to lay on left side, increase water intake, take Tylenol as needed, rest and if having more than 5-6 contractions in a hour to call clinic or go to [facility].  Clovis Pu, RN

## 2020-07-31 ENCOUNTER — Ambulatory Visit: Payer: Medicaid Other | Attending: Obstetrics and Gynecology

## 2020-07-31 ENCOUNTER — Ambulatory Visit: Payer: Medicaid Other | Admitting: *Deleted

## 2020-07-31 ENCOUNTER — Encounter: Payer: Self-pay | Admitting: *Deleted

## 2020-07-31 ENCOUNTER — Other Ambulatory Visit: Payer: Self-pay

## 2020-07-31 ENCOUNTER — Ambulatory Visit (INDEPENDENT_AMBULATORY_CARE_PROVIDER_SITE_OTHER): Payer: Medicaid Other

## 2020-07-31 VITALS — BP 124/77 | HR 94 | Temp 98.5°F | Wt 183.2 lb

## 2020-07-31 DIAGNOSIS — Z3400 Encounter for supervision of normal first pregnancy, unspecified trimester: Secondary | ICD-10-CM

## 2020-07-31 DIAGNOSIS — Z3A31 31 weeks gestation of pregnancy: Secondary | ICD-10-CM

## 2020-07-31 DIAGNOSIS — Z148 Genetic carrier of other disease: Secondary | ICD-10-CM | POA: Diagnosis not present

## 2020-07-31 DIAGNOSIS — Z363 Encounter for antenatal screening for malformations: Secondary | ICD-10-CM

## 2020-07-31 DIAGNOSIS — O26893 Other specified pregnancy related conditions, third trimester: Secondary | ICD-10-CM

## 2020-07-31 DIAGNOSIS — O99019 Anemia complicating pregnancy, unspecified trimester: Secondary | ICD-10-CM

## 2020-07-31 DIAGNOSIS — Z3A33 33 weeks gestation of pregnancy: Secondary | ICD-10-CM

## 2020-07-31 DIAGNOSIS — D573 Sickle-cell trait: Secondary | ICD-10-CM

## 2020-07-31 DIAGNOSIS — R102 Pelvic and perineal pain: Secondary | ICD-10-CM

## 2020-07-31 MED ORDER — COMFORT FIT MATERNITY SUPP MED MISC: 0 refills | Status: DC

## 2020-07-31 NOTE — Patient Instructions (Signed)

## 2020-07-31 NOTE — Progress Notes (Signed)
   LOW-RISK PREGNANCY OFFICE VISIT  Patient name: Shirley Navarro MRN [id number]  Date of birth: 07/23/1996 Chief Complaint:   Routine Prenatal Visit  Subjective:   Shirley Navarro is a 24 y.o. G1P0 female at [redacted]w[redacted]d with an Estimated Date of Delivery: 09/16/20 being seen today for ongoing management of a low-risk pregnancy aeb has Supervision of normal first pregnancy, antepartum and Sickle cell trait in mother affecting pregnancy ([facility]) on their problem list.  Patient presents today with complaint of pelvic pain. Patient endorses fetal movement and denies vaginal concerns including abnormal discharge, leaking of fluid, and bleeding. She denies abdominal cramping or contractions. Patient expresses concern with getting closer to delivery.   Contractions: Irritability. Vag. Bleeding: None.  Movement: Present.  Reviewed past medical,surgical, social, obstetrical and family history as well as problem list, medications and allergies.  Objective   Vitals:   07/31/20 1107  BP: 124/77  Pulse: 94  Temp: 98.5 F (36.9 C)  Weight: 183 lb 3.2 oz (83.1 kg)  Body mass index is 30.49 kg/m.  Total Weight Gain:42 lb 3.2 oz (19.1 kg)         Physical Examination:   General appearance: Well appearing, and in no distress  Mental status: Alert, oriented to person, place, and time  Skin: Warm & dry  Cardiovascular: Normal heart rate noted  Respiratory: Normal respiratory effort, no distress  Abdomen: Soft, gravid, nontender, AGA with Fundal height of Fundal Height: 33 cm  Pelvic: Cervical exam deferred           Extremities: Edema: None  Fetal Status: Fetal Heart Rate (bpm): 135  Movement: Present   No results found for this or any previous visit (from the past 24 hour(s)).  Assessment & Plan:  Low-risk pregnancy of a 24 y.o., G1P0 at [redacted]w[redacted]d with an Estimated Date of Delivery: 09/16/20   1. Supervision of normal first pregnancy, antepartum -Reassured that labor should not be feared as she does not  know what to expect. -Reviewed collection of GBS at next visit.  -Educated on GBS bacteria including what it is, why we test, and how and when we treat if needed.   2. [redacted] weeks gestation of pregnancy -Doing well with exception as below. -Discussed preparations for delivery. -Anticipatory guidance for upcoming appts.   3. Pelvic pain during pregnancy in third trimester, antepartum -Reviewed measures for pelvic pain including rest, hydration, and tylenol usage prn. -Discussed pelvic pain vs round ligament pain vs sciatica pain. -Will send in script for maternity belt.      Meds:  Meds ordered this encounter  Medications  . Elastic Bandages & Supports (COMFORT FIT MATERNITY SUPP MED) MISC    Sig: Wear belt during the day, as needed, and remove at night prior to bedtime.    Dispense:  1 each    Refill:  0    Order Specific Question:   Supervising Provider    Answer:   Reva Bores [[id number]]   Labs/procedures today:  Lab Orders  No laboratory test(s) ordered today     Reviewed: Preterm labor symptoms and general obstetric precautions including but not limited to vaginal bleeding, contractions, leaking of fluid and fetal movement were reviewed in detail with the patient.  All questions were answered.  Follow-up: Return in about 3 weeks (around 08/21/2020) for LROB with GBS.  No orders of the defined types were placed in this encounter.  Cherre Robins MSN, CNM 07/31/2020

## 2020-08-21 ENCOUNTER — Ambulatory Visit (INDEPENDENT_AMBULATORY_CARE_PROVIDER_SITE_OTHER): Payer: Medicaid Other | Admitting: Medical

## 2020-08-21 ENCOUNTER — Other Ambulatory Visit: Payer: Self-pay

## 2020-08-21 ENCOUNTER — Other Ambulatory Visit (HOSPITAL_COMMUNITY)
Admission: RE | Admit: 2020-08-21 | Discharge: 2020-08-21 | Disposition: A | Discharge status: Home / Self Care | Payer: Medicaid Other | Source: Ambulatory Visit | Attending: Medical | Admitting: Medical

## 2020-08-21 VITALS — BP 124/79 | HR 111 | Temp 98.6°F | Wt 187.0 lb

## 2020-08-21 DIAGNOSIS — B9689 Other specified bacterial agents as the cause of diseases classified elsewhere: Secondary | ICD-10-CM

## 2020-08-21 DIAGNOSIS — Z3400 Encounter for supervision of normal first pregnancy, unspecified trimester: Secondary | ICD-10-CM

## 2020-08-21 DIAGNOSIS — B373 Candidiasis of vulva and vagina: Secondary | ICD-10-CM

## 2020-08-21 DIAGNOSIS — N760 Acute vaginitis: Secondary | ICD-10-CM

## 2020-08-21 DIAGNOSIS — O99019 Anemia complicating pregnancy, unspecified trimester: Secondary | ICD-10-CM

## 2020-08-21 DIAGNOSIS — Z3A36 36 weeks gestation of pregnancy: Secondary | ICD-10-CM

## 2020-08-21 DIAGNOSIS — Z23 Encounter for immunization: Secondary | ICD-10-CM

## 2020-08-21 DIAGNOSIS — D573 Sickle-cell trait: Secondary | ICD-10-CM

## 2020-08-21 DIAGNOSIS — B3731 Acute candidiasis of vulva and vagina: Secondary | ICD-10-CM

## 2020-08-21 NOTE — Patient Instructions (Signed)
Braxton Hicks Contractions Contractions of the uterus can occur throughout pregnancy, but they are not always a sign that you are in labor. You may have practice contractions called Braxton Hicks contractions. These false labor contractions are sometimes confused with true labor. What are Braxton Hicks contractions? Braxton Hicks contractions are tightening movements that occur in the muscles of the uterus before labor. Unlike true labor contractions, these contractions do not result in opening (dilation) and thinning of the cervix. Toward the end of pregnancy (32-34 weeks), Braxton Hicks contractions can happen more often and may become stronger. These contractions are sometimes difficult to tell apart from true labor because they can be very uncomfortable. You should not feel embarrassed if you go to the hospital with false labor. Sometimes, the only way to tell if you are in true labor is for your health care provider to look for changes in the cervix. The health care provider will do a physical exam and may monitor your contractions. If you are not in true labor, the exam should show that your cervix is not dilating and your water has not broken. If there are no other health problems associated with your pregnancy, it is completely safe for you to be sent home with false labor. You may continue to have Braxton Hicks contractions until you go into true labor. How to tell the difference between true labor and false labor True labor  Contractions last 30-70 seconds.  Contractions become very regular.  Discomfort is usually felt in the top of the uterus, and it spreads to the lower abdomen and low back.  Contractions do not go away with walking.  Contractions usually become more intense and increase in frequency.  The cervix dilates and gets thinner. False labor  Contractions are usually shorter and not as strong as true labor contractions.  Contractions are usually irregular.  Contractions  are often felt in the front of the lower abdomen and in the groin.  Contractions may go away when you walk around or change positions while lying down.  Contractions get weaker and are shorter-lasting as time goes on.  The cervix usually does not dilate or become thin. Follow these instructions at home:   Take over-the-counter and prescription medicines only as told by your health care provider.  Keep up with your usual exercises and follow other instructions from your health care provider.  Eat and drink lightly if you think you are going into labor.  If Braxton Hicks contractions are making you uncomfortable: ? Change your position from lying down or resting to walking, or change from walking to resting. ? Sit and rest in a tub of warm water. ? Drink enough fluid to keep your urine pale yellow. Dehydration may cause these contractions. ? Do slow and deep breathing several times an hour.  Keep all follow-up prenatal visits as told by your health care provider. This is important. Contact a health care provider if:  You have a fever.  You have continuous pain in your abdomen. Get help right away if:  Your contractions become stronger, more regular, and closer together.  You have fluid leaking or gushing from your vagina.  You pass blood-tinged mucus (bloody show).  You have bleeding from your vagina.  You have low back pain that you never had before.  You feel your baby's head pushing down and causing pelvic pressure.  Your baby is not moving inside you as much as it used to. Summary  Contractions that occur before labor are   called Braxton Hicks contractions, false labor, or practice contractions.  Braxton Hicks contractions are usually shorter, weaker, farther apart, and less regular than true labor contractions. True labor contractions usually become progressively stronger and regular, and they become more frequent.  Manage discomfort from Braxton Hicks contractions  by changing position, resting in a warm bath, drinking plenty of water, or practicing deep breathing. This information is not intended to replace advice given to you by your health care provider. Make sure you discuss any questions you have with your health care provider. Document Revised: 09/08/2017 Document Reviewed: 02/09/2017 Elsevier Patient Education  2020 [employer] Fetal Movement Counts Patient Name: ________________________________________________ Patient Due Date: ____________________ What is a fetal movement count?  A fetal movement count is the number of times that you feel your baby move during a certain amount of time. This may also be called a fetal kick count. A fetal movement count is recommended for every pregnant woman. You may be asked to start counting fetal movements as early as week 28 of your pregnancy. Pay attention to when your baby is most active. You may notice your baby's sleep and wake cycles. You may also notice things that make your baby move more. You should do a fetal movement count:  When your baby is normally most active.  At the same time each day. A good time to count movements is while you are resting, after having something to eat and drink. How do I count fetal movements? 1. Find a quiet, comfortable area. Sit, or lie down on your side. 2. Write down the date, the start time and stop time, and the number of movements that you felt between those two times. Take this information with you to your health care visits. 3. Write down your start time when you feel the first movement. 4. Count kicks, flutters, swishes, rolls, and jabs. You should feel at least 10 movements. 5. You may stop counting after you have felt 10 movements, or if you have been counting for 2 hours. Write down the stop time. 6. If you do not feel 10 movements in 2 hours, contact your health care provider for further instructions. Your health care provider may want to do additional tests  to assess your baby's well-being. Contact a health care provider if:  You feel fewer than 10 movements in 2 hours.  Your baby is not moving like he or she usually does. Date: ____________ Start time: ____________ Stop time: ____________ Movements: ____________ Date: ____________ Start time: ____________ Stop time: ____________ Movements: ____________ Date: ____________ Start time: ____________ Stop time: ____________ Movements: ____________ Date: ____________ Start time: ____________ Stop time: ____________ Movements: ____________ Date: ____________ Start time: ____________ Stop time: ____________ Movements: ____________ Date: ____________ Start time: ____________ Stop time: ____________ Movements: ____________ Date: ____________ Start time: ____________ Stop time: ____________ Movements: ____________ Date: ____________ Start time: ____________ Stop time: ____________ Movements: ____________ Date: ____________ Start time: ____________ Stop time: ____________ Movements: ____________ This information is not intended to replace advice given to you by your health care provider. Make sure you discuss any questions you have with your health care provider. Document Revised: 05/16/2019 Document Reviewed: 05/16/2019 Elsevier Patient Education  2020 [employer] Inc[employer]  

## 2020-08-21 NOTE — Progress Notes (Signed)
   PRENATAL VISIT NOTE  Subjective:  Shirley Navarro is a 24 y.o. G1P0 at [redacted]w[redacted]d being seen today for ongoing prenatal care.  She is currently monitored for the following issues for this low-risk pregnancy and has Supervision of normal first pregnancy, antepartum and Sickle cell trait in mother affecting pregnancy (HCC) on their problem list.  Patient reports backache and pelvic pressure.  Contractions: Not present. Vag. Bleeding: None.  Movement: Present. Denies leaking of fluid.   The following portions of the patient's history were reviewed and updated as appropriate: allergies, current medications, past family history, past medical history, past social history, past surgical history and problem list.   Objective:   Vitals:   08/21/20 1024  BP: 124/79  Pulse: (!) 111  Temp: 98.6 F (37 C)  Weight: 187 lb (84.8 kg)    Fetal Status: Fetal Heart Rate (bpm): 144   Movement: Present  Presentation: Vertex  General:  Alert, oriented and cooperative. Patient is in no acute distress.  Skin: Skin is warm and dry. No rash noted.   Cardiovascular: Normal heart rate noted  Respiratory: Normal respiratory effort, no problems with respiration noted  Abdomen: Soft, gravid, appropriate for gestational age.  Pain/Pressure: Present     Pelvic: Cervical exam performed in the presence of a chaperone Dilation: Closed Effacement (%): 50 Station: Ballotable  Extremities: Normal range of motion.  Edema: None  Mental Status: Normal mood and affect. Normal behavior. Normal judgment and thought content.   Assessment and Plan:  Pregnancy: G1P0 at [redacted]w[redacted]d 1. Supervision of normal first pregnancy, antepartum - Cervicovaginal ancillary only( [facility]) - Culture, beta strep (group b only) - Discussed COVID vaccine, patient plan to wait until after delivery  - Discussed flu shot and patient thinks she is getting cold and would like to defer to a later date  2. Sickle cell trait in mother affecting pregnancy  (HCC) - Negative urine culture for third trimester performed previously   3. [redacted] weeks gestation of pregnancy  Preterm labor symptoms and general obstetric precautions including but not limited to vaginal bleeding, contractions, leaking of fluid and fetal movement were reviewed in detail with the patient. Please refer to After Visit Summary for other counseling recommendations.   Return in about 1 week (around 08/28/2020) for LOB, In-Person.  Future Appointments  Date Time Provider Department Center  08/28/2020 10:10 AM Gerrit Heck, CNM [facility] None  09/02/2020  2:10 PM Raelyn Mora, CNM [facility] None  09/11/2020  9:30 AM Gerrit Heck, CNM [facility] None    Vonzella Nipple, PA-C

## 2020-08-24 LAB — CERVICOVAGINAL ANCILLARY ONLY
Bacterial Vaginitis (gardnerella): POSITIVE — AB
Candida Glabrata: NEGATIVE
Candida Vaginitis: POSITIVE — AB
Chlamydia: NEGATIVE
Comment: NEGATIVE
Comment: NEGATIVE
Comment: NEGATIVE
Comment: NEGATIVE
Comment: NEGATIVE
Comment: NORMAL
Neisseria Gonorrhea: NEGATIVE
Trichomonas: NEGATIVE

## 2020-08-24 LAB — CULTURE, BETA STREP (GROUP B ONLY): Strep Gp B Culture: POSITIVE — AB

## 2020-08-24 MED ORDER — METRONIDAZOLE 500 MG PO TABS: 500.0000 mg | ORAL_TABLET | Freq: Two times a day (BID) | ORAL | 0 refills | Status: DC

## 2020-08-24 MED ORDER — MICONAZOLE NITRATE 2 % VA CREA: 1.0000 | TOPICAL_CREAM | Freq: Every day | VAGINAL | 0 refills | Status: DC

## 2020-08-24 NOTE — Addendum Note (Signed)
Addended by: Marny Lowenstein on: 08/24/2020 03:26 PM   Modules accepted: Orders

## 2020-08-25 ENCOUNTER — Encounter (HOSPITAL_COMMUNITY): Payer: Self-pay | Admitting: Obstetrics and Gynecology

## 2020-08-25 ENCOUNTER — Other Ambulatory Visit: Payer: Self-pay

## 2020-08-25 ENCOUNTER — Inpatient Hospital Stay (HOSPITAL_COMMUNITY)
Admission: AD | Admit: 2020-08-25 | Discharge: 2020-08-25 | Disposition: A | Discharge status: Home / Self Care | Payer: Medicaid Other | Attending: Obstetrics and Gynecology | Admitting: Obstetrics and Gynecology

## 2020-08-25 DIAGNOSIS — O26893 Other specified pregnancy related conditions, third trimester: Secondary | ICD-10-CM | POA: Diagnosis not present

## 2020-08-25 DIAGNOSIS — R141 Gas pain: Secondary | ICD-10-CM | POA: Insufficient documentation

## 2020-08-25 DIAGNOSIS — O99820 Streptococcus B carrier state complicating pregnancy: Secondary | ICD-10-CM | POA: Insufficient documentation

## 2020-08-25 DIAGNOSIS — O99891 Other specified diseases and conditions complicating pregnancy: Secondary | ICD-10-CM | POA: Diagnosis not present

## 2020-08-25 DIAGNOSIS — Z3A36 36 weeks gestation of pregnancy: Secondary | ICD-10-CM | POA: Diagnosis not present

## 2020-08-25 DIAGNOSIS — M545 Low back pain, unspecified: Secondary | ICD-10-CM | POA: Diagnosis not present

## 2020-08-25 DIAGNOSIS — M549 Dorsalgia, unspecified: Secondary | ICD-10-CM | POA: Insufficient documentation

## 2020-08-25 DIAGNOSIS — Z3689 Encounter for other specified antenatal screening: Secondary | ICD-10-CM

## 2020-08-25 LAB — CBC
HCT: 36.6 % (ref 36.0–46.0)
Hemoglobin: 12.4 g/dL (ref 12.0–15.0)
MCH: 27.6 pg (ref 26.0–34.0)
MCHC: 33.9 g/dL (ref 30.0–36.0)
MCV: 81.3 fL (ref 80.0–100.0)
Platelets: 227 10*3/uL (ref 150–400)
RBC: 4.5 MIL/uL (ref 3.87–5.11)
RDW: 13.8 % (ref 11.5–15.5)
WBC: 9.5 10*3/uL (ref 4.0–10.5)
nRBC: 0 % (ref 0.0–0.2)

## 2020-08-25 LAB — COMPREHENSIVE METABOLIC PANEL
ALT: 34 U/L (ref 0–44)
AST: 28 U/L (ref 15–41)
Albumin: 3.3 g/dL — ABNORMAL LOW (ref 3.5–5.0)
Alkaline Phosphatase: 174 U/L — ABNORMAL HIGH (ref 38–126)
Anion gap: 10 (ref 5–15)
BUN: 8 mg/dL (ref 6–20)
CO2: 24 mmol/L (ref 22–32)
Calcium: 9.9 mg/dL (ref 8.9–10.3)
Chloride: 102 mmol/L (ref 98–111)
Creatinine, Ser: 0.8 mg/dL (ref 0.44–1.00)
GFR, Estimated: >60 mL/min (ref >60)
Glucose, Bld: 90 mg/dL (ref 70–99)
Potassium: 4.4 mmol/L (ref 3.5–5.1)
Sodium: 136 mmol/L (ref 135–145)
Total Bilirubin: 0.4 mg/dL (ref 0.3–1.2)
Total Protein: 7 g/dL (ref 6.5–8.1)

## 2020-08-25 MED ORDER — CYCLOBENZAPRINE HCL 10 MG PO TABS: 10.0000 mg | ORAL_TABLET | Freq: Three times a day (TID) | ORAL | 1 refills | Status: DC | PRN

## 2020-08-25 MED ORDER — SIMETHICONE 80 MG PO CHEW
80.0000 mg | CHEWABLE_TABLET | Freq: Once | ORAL | Status: AC
  Administered 2020-08-25: 80 mg via ORAL
  Filled 2020-08-25: qty 1

## 2020-08-25 MED ORDER — CYCLOBENZAPRINE HCL 5 MG PO TABS
10.0000 mg | ORAL_TABLET | Freq: Once | ORAL | Status: AC
  Administered 2020-08-25: 10 mg via ORAL
  Filled 2020-08-25: qty 2

## 2020-08-25 NOTE — MAU Provider Note (Addendum)
History   CSN: [id number]  Arrival date and time: 08/25/20 1304   First Provider Initiated Contact with Patient 08/25/20 1341      Chief Complaint  Patient presents with  . Abdominal Pain   HPI   Patient is G1P0 at [redacted]w[redacted]d who presents with abdominal and back pain. Patient states the pain started this morning and has progressively worsened. Patient notes the pain is in the epigastric area and radiates down the lateral portions of her abdomen. The pain also radiates to her lower back. Patient denies any nausea, vomiting, RUQ pain, dysuria, vaginal discharge or bleeding. Patient notes she is having some contractions, but the pain does not come and go with the contractions. Patient has not taken any medication to help relieve the pain. Patient does feel fetal movement. Patient does note that she was diagnosed with BV and yeast on 08/21/2020 and was prescribed Flagyl and Monistat vaginal cream but has not started taking them. Patient is also GBS positive.   OB History    Gravida  1   Para      Term      Preterm      AB      Living        SAB      TAB      Ectopic      Multiple      Live Births              Past Medical History:  Diagnosis Date  . Asthma     Past Surgical History:  Procedure Laterality Date  . NO PAST SURGERIES      Family History  Problem Relation Age of Onset  . Healthy Mother   . Healthy Father   . Breast cancer Paternal Aunt   . Diabetes Maternal Grandmother   . Breast cancer Paternal Grandmother     Social History   Tobacco Use  . Smoking status: Never Smoker  . Smokeless tobacco: Never Used  Vaping Use  . Vaping Use: Never used  Substance Use Topics  . Alcohol use: Not Currently    Comment: Socially  . Drug use: Not Currently    Types: Marijuana    Comment: Last smoked  April 2021    Allergies:  Allergies  Allergen Reactions  . Codeine Rash    No medications prior to admission.    Review of Systems   Gastrointestinal: Positive for abdominal pain. Negative for constipation, diarrhea, nausea and vomiting.  Genitourinary: Negative for dysuria, flank pain, pelvic pain, vaginal bleeding and vaginal discharge.  Musculoskeletal: Positive for back pain.  Neurological: Negative for dizziness and headaches.   Physical Exam   Blood pressure 126/69, pulse (!) 103, temperature 98.4 F (36.9 C), resp. rate 15, weight 186 lb 5 oz (84.5 kg), last menstrual period 12/11/2019, SpO2 100 %.  Physical Exam Constitutional:      General: She is not in acute distress. HENT:     Head: Normocephalic and atraumatic.  Pulmonary:     Effort: Pulmonary effort is normal.  Abdominal:     General: There is no distension.     Palpations: Abdomen is soft.     Tenderness: There is abdominal tenderness (on both sides) in the epigastric area. There is no guarding.     Comments: Gravid abdomen  Musculoskeletal:     Cervical back: Normal range of motion and neck supple.  Skin:    General: Skin is warm and dry.  Neurological:  General: No focal deficit present.     Mental Status: She is alert and oriented to person, place, and time.  Psychiatric:        Mood and Affect: Mood normal.        Behavior: Behavior normal.     MAU Course   Results for orders placed or performed during the hospital encounter of 08/25/20 (from the past 24 hour(s))  Comprehensive metabolic panel     Status: Abnormal   Collection Time: 08/25/20  2:11 PM  Result Value Ref Range   Sodium 136 135 - 145 mmol/L   Potassium 4.4 3.5 - 5.1 mmol/L   Chloride 102 98 - 111 mmol/L   CO2 24 22 - 32 mmol/L   Glucose, Bld 90 70 - 99 mg/dL   BUN 8 6 - 20 mg/dL   Creatinine, Ser 7.51 0.44 - 1.00 mg/dL   Calcium 9.9 8.9 - 02.5 mg/dL   Total Protein 7.0 6.5 - 8.1 g/dL   Albumin 3.3 (L) 3.5 - 5.0 g/dL   AST 28 15 - 41 U/L   ALT 34 0 - 44 U/L   Alkaline Phosphatase 174 (H) 38 - 126 U/L   Total Bilirubin 0.4 0.3 - 1.2 mg/dL   GFR, Estimated  >85 >27 mL/min   Anion gap 10 5 - 15  CBC     Status: None   Collection Time: 08/25/20  2:11 PM  Result Value Ref Range   WBC 9.5 4.0 - 10.5 K/uL   RBC 4.50 3.87 - 5.11 MIL/uL   Hemoglobin 12.4 12.0 - 15.0 g/dL   HCT 78.2 36 - 46 %   MCV 81.3 80.0 - 100.0 fL   MCH 27.6 26.0 - 34.0 pg   MCHC 33.9 30.0 - 36.0 g/dL   RDW 42.3 53.6 - 14.4 %   Platelets 227 150 - 400 K/uL   nRBC 0.0 0.0 - 0.2 %   MDM Patient presenting with abdominal and low back pain.  Abdominal Pain: Pain in epigastric region and radiation down lateral portion of abdomen. No RUQ pain. TTP in epigastric region. CBC within normal limits. CMP showed no significant abnormalities. Patient's BP within normal limits at 121/76. Patient most likely does not have pre-eclampsia or HELLP syndrome due to normal BP, CBC, and CMP. Patient's pain most likely from gas. Patient given Mylicon 80mg  for gas relief  Low Back Pain: Patient pain radiating to mid low back. No flank pain. Most likely musculoskeletal pain. Patient given Flexeril 10mg  for symptom relief.   Pt reported good relief of abdominal discomfort and pelvic pain at reassessment.  Assessment and Plan  1. Gas pain 2. Musculoskeletal low back pain  -Discharge patient home and follow up in office as scheduled. -If abdominal pain continues, can take OTC simethicone discuss with provider at next office visit on 08/27/2020. -Take Flexeril 10mg  PO PRN for back pain relief.  , PA-S  Attestation of Supervision of Student:  I confirm that I have verified the information documented in the physician assistant student's note and that I have also personally performed the history, physical exam and all medical decision making activities.  I have verified that all services and findings are accurately documented in this student's note; and I agree with management and plan as outlined in the documentation. I have also made any necessary editorial changes.  08/29/2020, CNM [facility] , [facility] 08/25/2020 4:53 PM

## 2020-08-25 NOTE — MAU Note (Signed)
Patient reports upper abdominal pain that is constant and lower abdominal pressure.  Uncertain whether it feels like contractions or not.  Denies LOF/VB.  Reports having some contractions last night and then this pain started this morning.  Started out dull and has increased.  Endorses + FM.

## 2020-08-25 NOTE — Discharge Instructions (Signed)

## 2020-08-26 ENCOUNTER — Encounter: Payer: Self-pay | Admitting: General Practice

## 2020-08-28 ENCOUNTER — Ambulatory Visit (INDEPENDENT_AMBULATORY_CARE_PROVIDER_SITE_OTHER): Payer: Medicaid Other

## 2020-08-28 ENCOUNTER — Other Ambulatory Visit: Payer: Self-pay

## 2020-08-28 VITALS — BP 121/79 | HR 97 | Temp 98.1°F | Wt 192.4 lb

## 2020-08-28 DIAGNOSIS — Z3402 Encounter for supervision of normal first pregnancy, second trimester: Secondary | ICD-10-CM

## 2020-08-28 DIAGNOSIS — Z3A37 37 weeks gestation of pregnancy: Secondary | ICD-10-CM

## 2020-08-28 DIAGNOSIS — B951 Streptococcus, group B, as the cause of diseases classified elsewhere: Secondary | ICD-10-CM | POA: Insufficient documentation

## 2020-08-28 NOTE — Patient Instructions (Signed)

## 2020-08-28 NOTE — Progress Notes (Signed)
   LOW-RISK PREGNANCY OFFICE VISIT  Patient name: Shirley Navarro MRN [id number]  Date of birth: [birth year] Chief Complaint:   Routine Prenatal Visit  Subjective:   Shirley Navarro is a 24 y.o. G1P0 female at [redacted]w[redacted]d with an Estimated Date of Delivery: 09/16/20 being seen today for ongoing management of a Low-risk pregnancy aeb has Supervision of normal first pregnancy, antepartum; Sickle cell trait in mother affecting pregnancy (HCC); and Group beta Strep positive on their problem list.  Patient presents today with complaint of pelvic pressure. Patient endorses fetal movement and denies vaginal concerns including abnormal discharge, leaking of fluid, and bleeding. She denies perception of contractions.  Contractions: Irritability. Vag. Bleeding: None.  Movement: Present.  Reviewed past medical,surgical, social, obstetrical and family history as well as problem list, medications and allergies.  Objective   Vitals:   08/28/20 1001  BP: 121/79  Pulse: 97  Temp: 98.1 F (36.7 C)  Weight: 192 lb 6.4 oz (87.3 kg)  Body mass index is 32.02 kg/m.  Total Weight Gain:51 lb 6.4 oz (23.3 kg)         Physical Examination:   General appearance: Well appearing, and in no distress  Mental status: Alert, oriented to person, place, and time  Skin: Warm & dry  Cardiovascular: Normal heart rate noted  Respiratory: Normal respiratory effort, no distress  Abdomen: Soft, gravid, nontender, AGA with Fundal height of Fundal Height: 39 cm  Pelvic: Cervical exam deferred           Extremities: Edema: None  Fetal Status: Fetal Heart Rate (bpm): 141  Movement: Present   No results found for this or any previous visit (from the past 24 hour(s)).  Assessment & Plan:  Low-risk pregnancy of a 24 y.o., G1P0 at [redacted]w[redacted]d with an Estimated Date of Delivery: 09/16/20   1. Encounter for supervision of normal first pregnancy in second trimester -Pedication selected: [facility] -Plans patch for [facility] -Open to  epidural with regards to pain management in labor.  2. [redacted] weeks gestation of pregnancy -Doing well. -Reassured that pelvic pain is normal during this stage of pregnancy. -Informed that if she desires she can have cervical exam at next visit.   3. Group beta Strep positive -Will treat in labor.     Meds: No orders of the defined types were placed in this encounter.  Labs/procedures today:  Lab Orders  No laboratory test(s) ordered today     Reviewed: Term labor symptoms and general obstetric precautions including but not limited to vaginal bleeding, contractions, leaking of fluid and fetal movement were reviewed in detail with the patient.  All questions were answered.  Follow-up: No follow-ups on file.  No orders of the defined types were placed in this encounter.  Cherre Robins MSN, CNM 08/28/2020

## 2020-09-02 ENCOUNTER — Ambulatory Visit (INDEPENDENT_AMBULATORY_CARE_PROVIDER_SITE_OTHER): Payer: Medicaid Other | Admitting: Obstetrics and Gynecology

## 2020-09-02 ENCOUNTER — Encounter (HOSPITAL_COMMUNITY): Payer: Self-pay | Admitting: Obstetrics & Gynecology

## 2020-09-02 ENCOUNTER — Other Ambulatory Visit: Payer: Self-pay

## 2020-09-02 ENCOUNTER — Encounter: Payer: Self-pay | Admitting: Obstetrics and Gynecology

## 2020-09-02 ENCOUNTER — Inpatient Hospital Stay (HOSPITAL_COMMUNITY)
Admission: AD | Admit: 2020-09-02 | Discharge: 2020-09-02 | Disposition: A | Discharge status: Home / Self Care | Payer: Medicaid Other | Attending: Obstetrics & Gynecology | Admitting: Obstetrics & Gynecology

## 2020-09-02 VITALS — BP 124/75 | HR 109 | Temp 97.7°F | Wt 196.6 lb

## 2020-09-02 DIAGNOSIS — Z3400 Encounter for supervision of normal first pregnancy, unspecified trimester: Secondary | ICD-10-CM

## 2020-09-02 DIAGNOSIS — Z3A38 38 weeks gestation of pregnancy: Secondary | ICD-10-CM | POA: Diagnosis not present

## 2020-09-02 DIAGNOSIS — O26851 Spotting complicating pregnancy, first trimester: Secondary | ICD-10-CM

## 2020-09-02 DIAGNOSIS — B951 Streptococcus, group B, as the cause of diseases classified elsewhere: Secondary | ICD-10-CM

## 2020-09-02 DIAGNOSIS — O26893 Other specified pregnancy related conditions, third trimester: Secondary | ICD-10-CM | POA: Diagnosis present

## 2020-09-02 DIAGNOSIS — O471 False labor at or after 37 completed weeks of gestation: Secondary | ICD-10-CM | POA: Diagnosis not present

## 2020-09-02 DIAGNOSIS — Z3403 Encounter for supervision of normal first pregnancy, third trimester: Secondary | ICD-10-CM

## 2020-09-02 DIAGNOSIS — O479 False labor, unspecified: Secondary | ICD-10-CM

## 2020-09-02 NOTE — Discharge Instructions (Signed)
Labor and Vaginal Delivery  Vaginal delivery means that you give birth by pushing your baby out of your birth canal (vagina). A team of health care providers will help you before, during, and after vaginal delivery. Birth experiences are unique for every woman and every pregnancy, and birth experiences vary depending on where you choose to give birth. What happens when I arrive at the birth center or hospital? Once you are in labor and have been admitted into the hospital or birth center, your health care provider may:  Review your pregnancy history and any concerns that you have.  Insert an IV into one of your veins. This may be used to give you fluids and medicines.  Check your blood pressure, pulse, temperature, and heart rate (vital signs).  Check whether your bag of water (amniotic sac) has broken (ruptured).  Talk with you about your birth plan and discuss pain control options. Monitoring Your health care provider may monitor your contractions (uterine monitoring) and your baby's heart rate (fetal monitoring). You may need to be monitored:  Often, but not continuously (intermittently).  All the time or for long periods at a time (continuously). Continuous monitoring may be needed if: ? You are taking certain medicines, such as medicine to relieve pain or make your contractions stronger. ? You have pregnancy or labor complications. Monitoring may be done by:  Placing a special stethoscope or a handheld monitoring device on your abdomen to check your baby's heartbeat and to check for contractions.  Placing monitors on your abdomen (external monitors) to record your baby's heartbeat and the frequency and length of contractions.  Placing monitors inside your uterus through your vagina (internal monitors) to record your baby's heartbeat and the frequency, length, and strength of your contractions. Depending on the type of monitor, it may remain in your uterus or on your baby's head  until birth.  Telemetry. This is a type of continuous monitoring that can be done with external or internal monitors. Instead of having to stay in bed, you are able to move around during telemetry. Physical exam Your health care provider may perform frequent physical exams. This may include:  Checking how and where your baby is positioned in your uterus.  Checking your cervix to determine: ? Whether it is thinning out (effacing). ? Whether it is opening up (dilating). What happens during labor and delivery?  Normal labor and delivery is divided into the following three stages: Stage 1  This is the longest stage of labor.  This stage can last for hours or days.  Throughout this stage, you will feel contractions. Contractions generally feel mild, infrequent, and irregular at first. They get stronger, more frequent (about every 2-3 minutes), and more regular as you move through this stage.  This stage ends when your cervix is completely dilated to 4 inches (10 cm) and completely effaced. Stage 2  This stage starts once your cervix is completely effaced and dilated and lasts until the delivery of your baby.  This stage may last from 20 minutes to 2 hours.  This is the stage where you will feel an urge to push your baby out of your vagina.  You may feel stretching and burning pain, especially when the widest part of your baby's head passes through the vaginal opening (crowning).  Once your baby is delivered, the umbilical cord will be clamped and cut. This usually occurs after waiting a period of 1-2 minutes after delivery.  Your baby will be placed on your   bare chest (skin-to-skin contact) in an upright position and covered with a warm blanket. Watch your baby for feeding cues, like rooting or sucking, and help the baby to your breast for his or her first feeding. Stage 3  This stage starts immediately after the birth of your baby and ends after you deliver the placenta.  This  stage may take anywhere from 5 to 30 minutes.  After your baby has been delivered, you will feel contractions as your body expels the placenta and your uterus contracts to control bleeding. What can I expect after labor and delivery?  After labor is over, you and your baby will be monitored closely until you are ready to go home to ensure that you are both healthy. Your health care team will teach you how to care for yourself and your baby.  You and your baby will stay in the same room (rooming in) during your hospital stay. This will encourage early bonding and successful breastfeeding.  You may continue to receive fluids and medicines through an IV.  Your uterus will be checked and massaged regularly (fundal massage).  You will have some soreness and pain in your abdomen, vagina, and the area of skin between your vaginal opening and your anus (perineum).  If an incision was made near your vagina (episiotomy) or if you had some vaginal tearing during delivery, cold compresses may be placed on your episiotomy or your tear. This helps to reduce pain and swelling.  You may be given a squirt bottle to use instead of wiping when you go to the bathroom. To use the squirt bottle, follow these steps: ? Before you urinate, fill the squirt bottle with warm water. Do not use hot water. ? After you urinate, while you are sitting on the toilet, use the squirt bottle to rinse the area around your urethra and vaginal opening. This rinses away any urine and blood. ? Fill the squirt bottle with clean water every time you use the bathroom.  It is normal to have vaginal bleeding after delivery. Wear a sanitary pad for vaginal bleeding and discharge. Summary  Vaginal delivery means that you will give birth by pushing your baby out of your birth canal (vagina).  Your health care provider may monitor your contractions (uterine monitoring) and your baby's heart rate (fetal monitoring).  Your health care  provider may perform a physical exam.  Normal labor and delivery is divided into three stages.  After labor is over, you and your baby will be monitored closely until you are ready to go home. This information is not intended to replace advice given to you by your health care provider. Make sure you discuss any questions you have with your health care provider. Document Revised: 10/31/2017 Document Reviewed: 10/31/2017 Elsevier Patient Education  2020 [employer]  

## 2020-09-02 NOTE — Progress Notes (Signed)
   LOW-RISK PREGNANCY OFFICE VISIT Patient name: Shirley Navarro MRN [id number]  Date of birth: Dec 09, 1995 Chief Complaint:   Routine Prenatal Visit  History of Present Illness:   Shirley Navarro is a 24 y.o. G1P0 female at [redacted]w[redacted]d with an Estimated Date of Delivery: 09/16/20 being seen today for ongoing management of a low-risk pregnancy.  Today she reports occasional contractions. Contractions: Irregular. Vag. Bleeding: None.  Movement: Present. denies leaking of fluid. Review of Systems:   Pertinent items are noted in HPI Denies abnormal vaginal discharge w/ itching/odor/irritation, headaches, visual changes, shortness of breath, chest pain, abdominal pain, severe nausea/vomiting, or problems with urination or bowel movements unless otherwise stated above. Pertinent History Reviewed:  Reviewed past medical,surgical, social, obstetrical and family history.  Reviewed problem list, medications and allergies. Physical Assessment:   Vitals:   09/02/20 1411  BP: 124/75  Pulse: (!) 109  Temp: 97.7 F (36.5 C)  Weight: 196 lb 9.6 oz (89.2 kg)  Body mass index is 32.72 kg/m.        Physical Examination:   General appearance: Well appearing, and in no distress  Mental status: Alert, oriented to person, place, and time  Skin: Warm & dry  Cardiovascular: Normal heart rate noted  Respiratory: Normal respiratory effort, no distress  Abdomen: Soft, gravid, nontender  Pelvic: Cervical exam performed  Dilation: 3 Effacement (%): 70 Station: -3  Extremities: Edema: None  Fetal Status: Fetal Heart Rate (bpm): 149 Fundal Height: 40 cm Movement: Present Presentation: Vertex  No results found for this or any previous visit (from the past 24 hour(s)).  Assessment & Plan:  1) Low-risk pregnancy G1P0 at [redacted]w[redacted]d with an Estimated Date of Delivery: 09/16/20    2) Encounter for supervision of normal first pregnancy in third trimester - Discussed labor signs - Educated patient and partner on timing of  contractions  3) Group beta Strep positive - Abx in labor  4) [redacted] weeks gestation of pregnancy   Meds: No orders of the defined types were placed in this encounter.  Labs/procedures today: cervical exam  Plan:  Continue routine obstetrical care   Reviewed: Term labor symptoms and general obstetric precautions including but not limited to vaginal bleeding, contractions, leaking of fluid and fetal movement were reviewed in detail with the patient.  All questions were answered. Has home bp cuff. Check bp weekly, let us know if >140/90.   Follow-up: No follow-ups on file.  No orders of the defined types were placed in this encounter.  Raelyn Mora MSN, CNM 09/02/2020 9:14 PM

## 2020-09-02 NOTE — MAU Note (Signed)
Pt reports she started spotting this pm, having back pain. Pt was checked in office today and was 3cm. Feels a lot of pressure.

## 2020-09-02 NOTE — MAU Provider Note (Signed)
S: Ms. Shirley Navarro is a 24 y.o. G1P0 at 102w0d  who presents to MAU today for labor evaluation.     Cervical exam by RN:  Dilation: 3 Effacement (%): 70 Cervical Position: Posterior Station: -3 Presentation: Vertex Exam by:: Erle Crocker, RN  Fetal Monitoring: Baseline: 140 Variability: Average Accelerations: present Decelerations: absent Contractions: irreg  MDM Discussed patient with RN. NST reviewed.   A: SIUP at [redacted]w[redacted]d  False labor  P: Discharge home Labor precautions and kick counts included in AVS Patient to follow-up with office as scheduled  Patient may return to [facility]U as needed or when in labor   Valora Piccolo 09/02/2020 10:33 PM

## 2020-09-04 ENCOUNTER — Other Ambulatory Visit: Payer: Self-pay

## 2020-09-04 ENCOUNTER — Inpatient Hospital Stay (HOSPITAL_COMMUNITY)
Admission: AD | Admit: 2020-09-04 | Discharge: 2020-09-05 | Disposition: A | Discharge status: Home / Self Care | Payer: 59 | Attending: Family Medicine | Admitting: Family Medicine

## 2020-09-04 ENCOUNTER — Encounter (HOSPITAL_COMMUNITY): Payer: Self-pay | Admitting: Family Medicine

## 2020-09-04 DIAGNOSIS — O99019 Anemia complicating pregnancy, unspecified trimester: Secondary | ICD-10-CM

## 2020-09-04 DIAGNOSIS — Z3A39 39 weeks gestation of pregnancy: Secondary | ICD-10-CM | POA: Insufficient documentation

## 2020-09-04 DIAGNOSIS — Z3A38 38 weeks gestation of pregnancy: Secondary | ICD-10-CM

## 2020-09-04 DIAGNOSIS — D573 Sickle-cell trait: Secondary | ICD-10-CM

## 2020-09-04 DIAGNOSIS — Z3400 Encounter for supervision of normal first pregnancy, unspecified trimester: Secondary | ICD-10-CM

## 2020-09-04 DIAGNOSIS — B951 Streptococcus, group B, as the cause of diseases classified elsewhere: Secondary | ICD-10-CM

## 2020-09-04 DIAGNOSIS — O479 False labor, unspecified: Secondary | ICD-10-CM

## 2020-09-04 NOTE — MAU Note (Signed)
PT SAYS [facility] SINCE 930PM-  [facility] WITH  [facility] ON [facility] -  VE ON 24TH-  3 CM.  DENIES HSV AND MRSA. GBS- POSITIVE

## 2020-09-05 DIAGNOSIS — O479 False labor, unspecified: Secondary | ICD-10-CM | POA: Diagnosis present

## 2020-09-05 DIAGNOSIS — Z3A39 39 weeks gestation of pregnancy: Secondary | ICD-10-CM | POA: Diagnosis not present

## 2020-09-05 NOTE — Discharge Instructions (Signed)
Vaginal Delivery  Vaginal delivery means that you give birth by pushing your baby out of your birth canal (vagina). A team of health care providers will help you before, during, and after vaginal delivery. Birth experiences are unique for every woman and every pregnancy, and birth experiences vary depending on where you choose to give birth. What happens when I arrive at the birth center or hospital? Once you are in labor and have been admitted into the hospital or birth center, your health care provider may: Review your pregnancy history and any concerns that you have. Insert an IV into one of your veins. This may be used to give you fluids and medicines. Check your blood pressure, pulse, temperature, and heart rate (vital signs). Check whether your bag of water (amniotic sac) has broken (ruptured). Talk with you about your birth plan and discuss pain control options. Monitoring Your health care provider may monitor your contractions (uterine monitoring) and your baby's heart rate (fetal monitoring). You may need to be monitored: Often, but not continuously (intermittently). All the time or for long periods at a time (continuously). Continuous monitoring may be needed if: You are taking certain medicines, such as medicine to relieve pain or make your contractions stronger. You have pregnancy or labor complications. Monitoring may be done by: Placing a special stethoscope or a handheld monitoring device on your abdomen to check your baby's heartbeat and to check for contractions. Placing monitors on your abdomen (external monitors) to record your baby's heartbeat and the frequency and length of contractions. Placing monitors inside your uterus through your vagina (internal monitors) to record your baby's heartbeat and the frequency, length, and strength of your contractions. Depending on the type of monitor, it may remain in your uterus or on your baby's head until birth. Telemetry. This is a  type of continuous monitoring that can be done with external or internal monitors. Instead of having to stay in bed, you are able to move around during telemetry. Physical exam Your health care provider may perform frequent physical exams. This may include: Checking how and where your baby is positioned in your uterus. Checking your cervix to determine: Whether it is thinning out (effacing). Whether it is opening up (dilating). What happens during labor and delivery?  Normal labor and delivery is divided into the following three stages: Stage 1 This is the longest stage of labor. This stage can last for hours or days. Throughout this stage, you will feel contractions. Contractions generally feel mild, infrequent, and irregular at first. They get stronger, more frequent (about every 2-3 minutes), and more regular as you move through this stage. This stage ends when your cervix is completely dilated to 4 inches (10 cm) and completely effaced. Stage 2 This stage starts once your cervix is completely effaced and dilated and lasts until the delivery of your baby. This stage may last from 20 minutes to 2 hours. This is the stage where you will feel an urge to push your baby out of your vagina. You may feel stretching and burning pain, especially when the widest part of your baby's head passes through the vaginal opening (crowning). Once your baby is delivered, the umbilical cord will be clamped and cut. This usually occurs after waiting a period of 1-2 minutes after delivery. Your baby will be placed on your bare chest (skin-to-skin contact) in an upright position and covered with a warm blanket. Watch your baby for feeding cues, like rooting or sucking, and help the baby to  your breast for his or her first feeding. Stage 3 This stage starts immediately after the birth of your baby and ends after you deliver the placenta. This stage may take anywhere from 5 to 30 minutes. After your baby has been  delivered, you will feel contractions as your body expels the placenta and your uterus contracts to control bleeding. What can I expect after labor and delivery? After labor is over, you and your baby will be monitored closely until you are ready to go home to ensure that you are both healthy. Your health care team will teach you how to care for yourself and your baby. You and your baby will stay in the same room (rooming in) during your hospital stay. This will encourage early bonding and successful breastfeeding. You may continue to receive fluids and medicines through an IV. Your uterus will be checked and massaged regularly (fundal massage). You will have some soreness and pain in your abdomen, vagina, and the area of skin between your vaginal opening and your anus (perineum). If an incision was made near your vagina (episiotomy) or if you had some vaginal tearing during delivery, cold compresses may be placed on your episiotomy or your tear. This helps to reduce pain and swelling. You may be given a squirt bottle to use instead of wiping when you go to the bathroom. To use the squirt bottle, follow these steps: Before you urinate, fill the squirt bottle with warm water. Do not use hot water. After you urinate, while you are sitting on the toilet, use the squirt bottle to rinse the area around your urethra and vaginal opening. This rinses away any urine and blood. Fill the squirt bottle with clean water every time you use the bathroom. It is normal to have vaginal bleeding after delivery. Wear a sanitary pad for vaginal bleeding and discharge. Summary Vaginal delivery means that you will give birth by pushing your baby out of your birth canal (vagina). Your health care provider may monitor your contractions (uterine monitoring) and your baby's heart rate (fetal monitoring). Your health care provider may perform a physical exam. Normal labor and delivery is divided into three stages. After labor  is over, you and your baby will be monitored closely until you are ready to go home. This information is not intended to replace advice given to you by your health care provider. Make sure you discuss any questions you have with your health care provider. Document Revised: 10/31/2017 Document Reviewed: 10/31/2017 Elsevier Patient Education  2020 [employer] First Stage of Labor Labor is your body's natural process of moving your baby and other structures, including the placenta and umbilical cord, out of your uterus. There are three stages of labor. How long each stage lasts is different for every woman. But certain events happen during each stage that are the same for everyone.  The first stage starts when true labor begins. This stage ends when your cervix, which is the opening from your uterus into your vagina, is completely open (dilated).  The second stage begins when your cervix is fully dilated and you start pushing. This stage ends when your baby is born.  The third stage is the delivery of the organ that nourished your baby during pregnancy (placenta). First stage of labor As your due date gets closer, you may start to notice certain physical changes that mean labor is going to start soon. You may feel that your baby has dropped lower into your pelvis. You may experience  irregular, often painless, contractions that go away when you walk around or lie down ([employer] contractions). This is also called false labor. The first stage of labor begins when you start having contractions that come at regular (evenly spaced) intervals and your cervix starts to get thinner and wider in preparation for your baby to pass through. Birth care providers measure the dilation of your cervix in centimeters (cm). One centimeter is a little less than one-half of an inch. The first stage ends when your cervix is dilated to 10 cm. The first stage of labor is divided into three phases:  Early  phase.  Active phase.  Transitional phase. The length of the first stage of labor varies. It may be longer if this is your first pregnancy. You may spend most of this stage at home trying to relax and stay comfortable. How does this affect me? During the first stage of labor, you will move through three phases. What happens in the early phase?  You will start to have regular contractions that last 30-60 seconds. Contractions may come every 5-20 minutes. Keep track of your contractions and call your birth care provider.  Your water may break during this phase.  You may notice a clear or slightly bloody discharge of mucus (mucus plug) from your vagina.  Your cervix will dilate to 3-6 cm. What happens in the active phase? The active phase usually lasts 3-5 hours. You may go to the hospital or birth center around this time. During the active phase:  Your contractions will become stronger, longer, and more uncomfortable.  Your contractions may last 45-90 seconds and come every 3-5 minutes.  You may feel lower back pain.  Your birth care providers may examine your cervix and feel your belly to find the position of your baby.  You may have a monitor strapped to your belly to measure your contractions and your baby's heart rate.  You may start using your pain management options.  Your cervix may be dilated to 6 cm and may start to dilate more quickly. What happens in the transitional phase? The transitional phase typically lasts from 30 minutes to 2 hours. At the end of this phase, your cervix will be fully dilated to 10 cm. During the transitional phase:  Contractions will get stronger and longer.  Contractions may last 60-90 seconds and come less than 2 minutes apart.  You may feel hot flashes, chills, or nausea. How does this affect my baby? During the first stage of labor, your baby will gradually move down into your birth canal. Follow these instructions at home and in the  hospital or birth center:   When labor first begins, try to stay calm. You are still in the early phase. If it is night, try to get some sleep. If it is day, try to relax and save your energy. You may want to make some calls and get ready to go to the hospital or birth center.  When you are in the early phase, try these methods to help ease discomfort: ? Deep breathing and muscle relaxation. ? Taking a walk. ? Taking a warm bath or shower.  Drink some fluids and have a light snack if you feel like it.  Keep track of your contractions.  Based on the plan you created with your birth care provider, call when your contractions indicate it is time.  If your water breaks, note the time, color, and odor of the fluid.  When you are in  the active phase, do your breathing exercises and rely on your support people and your team of birth care providers. Contact a health care provider if:  Your contractions are strong and regular.  You have lower back pain or cramping.  Your water breaks.  You lose your mucus plug. Get help right away if you:  Have a severe headache that does not go away.  Have changes in your vision.  Have severe pain in your upper belly.  Do not feel the baby move.  Have bright red bleeding. Summary  The first stage of labor starts when true labor begins, and it ends when your cervix is dilated to 10 cm.  The first stage of labor has three phases: early, active, and transitional.  Your baby moves into the birth canal during the first stage of labor.  You may have contractions that become stronger and longer. You may also lose your mucus plug and have your water break.  Call your birth care provider when your contractions are frequent and strong enough to go to the hospital or birth center. This information is not intended to replace advice given to you by your health care provider. Make sure you discuss any questions you have with your health care  provider. Document Revised: 01/17/2019 Document Reviewed: 12/10/2017 Elsevier Patient Education  2020 [employer].

## 2020-09-11 ENCOUNTER — Encounter (HOSPITAL_COMMUNITY): Payer: Self-pay | Admitting: *Deleted

## 2020-09-11 ENCOUNTER — Telehealth (HOSPITAL_COMMUNITY): Payer: Self-pay | Admitting: *Deleted

## 2020-09-11 ENCOUNTER — Ambulatory Visit (INDEPENDENT_AMBULATORY_CARE_PROVIDER_SITE_OTHER): Payer: 59

## 2020-09-11 ENCOUNTER — Other Ambulatory Visit: Payer: Self-pay

## 2020-09-11 VITALS — BP 121/76 | HR 92 | Temp 97.3°F | Wt 196.0 lb

## 2020-09-11 DIAGNOSIS — B951 Streptococcus, group B, as the cause of diseases classified elsewhere: Secondary | ICD-10-CM

## 2020-09-11 DIAGNOSIS — Z3400 Encounter for supervision of normal first pregnancy, unspecified trimester: Secondary | ICD-10-CM

## 2020-09-11 DIAGNOSIS — Z3A39 39 weeks gestation of pregnancy: Secondary | ICD-10-CM

## 2020-09-11 NOTE — Patient Instructions (Signed)

## 2020-09-11 NOTE — Telephone Encounter (Signed)
Preadmission screen  

## 2020-09-11 NOTE — Progress Notes (Signed)
   LOW-RISK PREGNANCY OFFICE VISIT  Patient name: Shirley Navarro MRN [id number]  Date of birth: 02-Nov-1995 Chief Complaint:   Routine Prenatal Visit  Subjective:   Shirley Navarro is a 24 y.o. G1P0 female at [redacted]w[redacted]d with an Estimated Date of Delivery: 09/16/20 being seen today for ongoing management of a low-risk pregnancy aeb has Supervision of normal first pregnancy, antepartum; Sickle cell trait in mother affecting pregnancy (HCC); and Group beta Strep positive on their problem list.  Patient presents today with complaint of occasional contractions. Patient reports she was seen in [facility] for labor evaluation.  Patient endorses fetal movement, but feel that they are not as prominent as they once where.  Patient denies vaginal concerns including abnormal discharge, leaking of fluid, and bleeding.  Contractions: Irregular. Vag. Bleeding: None.  Movement: Present.  Reviewed past medical,surgical, social, obstetrical and family history as well as problem list, medications and allergies.  Objective   Vitals:   09/11/20 0923  BP: 121/76  Pulse: 92  Temp: (!) 97.3 F (36.3 C)  Weight: 196 lb (88.9 kg)  Body mass index is 32.62 kg/m.  Total Weight Gain:55 lb (24.9 kg)         Physical Examination:   General appearance: Well appearing, and in no distress  Mental status: Alert, oriented to person, place, and time  Skin: Warm & dry  Cardiovascular: Normal heart rate noted  Respiratory: Normal respiratory effort, no distress  Abdomen: Soft, gravid, nontender, AGA with Fundal height of Fundal Height: 41 cm  Pelvic: Cervical exam deferred           Extremities: Edema: None  Fetal Status: Fetal Heart Rate (bpm): 139  Movement: Present   No results found for this or any previous visit (from the past 24 hour(s)).  Assessment & Plan:  Low-risk pregnancy of a 24 y.o., G1P0 at [redacted]w[redacted]d with an Estimated Date of Delivery: 09/16/20   1. Supervision of normal first pregnancy, antepartum -Anticipatory  guidance for upcoming appts. -Discussed IOL at 40+ weeks. Plan for 12/10 at 0700; Bishop Score ~6. -IOL and L&D Orders placed. -Plan to RTO in one week for visit with NST. -Discussed hospital visitor policy.    2. Group beta Strep positive -Plan to treat in labor. -Discussed how and why treatment occurs.   3. [redacted] weeks gestation of pregnancy -Doing well. -Experiencing some contractions, but has made no cervical change. -Reassured that it is normal for fetal movements to not feel as prominent due to lack of room.     Meds: No orders of the defined types were placed in this encounter.  Labs/procedures today:  Lab Orders  No laboratory test(s) ordered today     Reviewed: Term labor symptoms and general obstetric precautions including but not limited to vaginal bleeding, contractions, leaking of fluid and fetal movement were reviewed in detail with the patient.  All questions were answered.  Follow-up: Return in about 1 week (around 09/18/2020).  No orders of the defined types were placed in this encounter.  Cherre Robins MSN, CNM 09/11/2020

## 2020-09-13 ENCOUNTER — Other Ambulatory Visit: Payer: Self-pay | Admitting: Advanced Practice Midwife

## 2020-09-14 ENCOUNTER — Other Ambulatory Visit (HOSPITAL_COMMUNITY): Payer: 59

## 2020-09-14 ENCOUNTER — Encounter (HOSPITAL_COMMUNITY): Payer: Self-pay | Admitting: *Deleted

## 2020-09-14 ENCOUNTER — Telehealth (HOSPITAL_COMMUNITY): Payer: Self-pay | Admitting: *Deleted

## 2020-09-14 NOTE — Telephone Encounter (Signed)
Preadmission screen  

## 2020-09-15 ENCOUNTER — Other Ambulatory Visit (HOSPITAL_COMMUNITY): Payer: 59

## 2020-09-16 ENCOUNTER — Other Ambulatory Visit (HOSPITAL_COMMUNITY)
Admission: RE | Admit: 2020-09-16 | Discharge: 2020-09-16 | Disposition: A | Discharge status: Home / Self Care | Payer: Commercial Managed Care - HMO | Source: Ambulatory Visit | Attending: Family Medicine | Admitting: Family Medicine

## 2020-09-16 ENCOUNTER — Other Ambulatory Visit: Payer: Self-pay | Admitting: Advanced Practice Midwife

## 2020-09-16 ENCOUNTER — Inpatient Hospital Stay (HOSPITAL_COMMUNITY): Payer: Commercial Managed Care - HMO

## 2020-09-16 DIAGNOSIS — Z20822 Contact with and (suspected) exposure to covid-19: Secondary | ICD-10-CM | POA: Insufficient documentation

## 2020-09-16 DIAGNOSIS — Z01812 Encounter for preprocedural laboratory examination: Secondary | ICD-10-CM | POA: Insufficient documentation

## 2020-09-16 LAB — SARS CORONAVIRUS 2 (TAT 6-24 HRS): SARS Coronavirus 2: NEGATIVE

## 2020-09-17 ENCOUNTER — Other Ambulatory Visit: Payer: Self-pay

## 2020-09-17 ENCOUNTER — Encounter: Payer: Self-pay | Admitting: Obstetrics and Gynecology

## 2020-09-17 ENCOUNTER — Ambulatory Visit (INDEPENDENT_AMBULATORY_CARE_PROVIDER_SITE_OTHER): Payer: 59 | Admitting: Obstetrics and Gynecology

## 2020-09-17 VITALS — BP 124/85 | HR 98 | Temp 98.6°F | Wt 199.6 lb

## 2020-09-17 DIAGNOSIS — Z3A40 40 weeks gestation of pregnancy: Secondary | ICD-10-CM

## 2020-09-17 DIAGNOSIS — B951 Streptococcus, group B, as the cause of diseases classified elsewhere: Secondary | ICD-10-CM

## 2020-09-17 DIAGNOSIS — Z3400 Encounter for supervision of normal first pregnancy, unspecified trimester: Secondary | ICD-10-CM

## 2020-09-17 NOTE — Progress Notes (Signed)
   LOW-RISK PREGNANCY OFFICE VISIT Patient name: Shirley Navarro MRN [id number]  Date of birth: Feb 17, 1996 Chief Complaint:   Routine Prenatal Visit  History of Present Illness:   Shirley Navarro is a 24 y.o. G1P0 female at [redacted]w[redacted]d with an Estimated Date of Delivery: 09/16/20 being seen today for ongoing management of a low-risk pregnancy.  Today she reports no complaints. Contractions: Irregular. Vag. Bleeding: None.  Movement: Present. denies leaking of fluid. Review of Systems:   Pertinent items are noted in HPI Denies abnormal vaginal discharge w/ itching/odor/irritation, headaches, visual changes, shortness of breath, chest pain, abdominal pain, severe nausea/vomiting, or problems with urination or bowel movements unless otherwise stated above. Pertinent History Reviewed:  Reviewed past medical,surgical, social, obstetrical and family history.  Reviewed problem list, medications and allergies. Physical Assessment:   Vitals:   09/17/20 1012  BP: 124/85  Pulse: 98  Temp: 98.6 F (37 C)  Weight: 199 lb 9.6 oz (90.5 kg)  Body mass index is 33.22 kg/m.        Physical Examination:   General appearance: Well appearing, and in no distress  Mental status: Alert, oriented to person, place, and time  Skin: Warm & dry  Cardiovascular: Normal heart rate noted  Respiratory: Normal respiratory effort, no distress  Abdomen: Soft, gravid, nontender  Pelvic: Cervical exam performed  Dilation: 3 Effacement (%): 70 Station: -3  Extremities: Edema: None  Fetal Status: Fetal Heart Rate (bpm): 141 Fundal Height: 42 cm Movement: Present Presentation: Vertex  No results found for this or any previous visit (from the past 24 hour(s)).  Assessment & Plan:  1) Low-risk pregnancy G1P0 at [redacted]w[redacted]d with an Estimated Date of Delivery: 09/16/20   2) Supervision of normal first pregnancy, antepartum - Discussed IOL tomorrow  3) Group beta Strep positive - Discussed abx needed in labor  4) [redacted] weeks  gestation of pregnancy    Meds: No orders of the defined types were placed in this encounter.  Labs/procedures today: cervical exam  Plan:  Continue routine obstetrical care   Reviewed: Term labor symptoms and general obstetric precautions including but not limited to vaginal bleeding, contractions, leaking of fluid and fetal movement were reviewed in detail with the patient.  All questions were answered. Has home bp cuff. Check bp weekly, let us know if >140/90.   Follow-up: No follow-ups on file.  No orders of the defined types were placed in this encounter.  Raelyn Mora MSN, CNM 09/17/2020

## 2020-09-18 ENCOUNTER — Encounter (HOSPITAL_COMMUNITY): Payer: Self-pay | Admitting: Family Medicine

## 2020-09-18 ENCOUNTER — Other Ambulatory Visit: Payer: Self-pay

## 2020-09-18 ENCOUNTER — Inpatient Hospital Stay (HOSPITAL_COMMUNITY): Payer: Commercial Managed Care - HMO | Admitting: Anesthesiology

## 2020-09-18 ENCOUNTER — Inpatient Hospital Stay (HOSPITAL_COMMUNITY)
Admission: AD | Admit: 2020-09-18 | Discharge: 2020-09-20 | DRG: 807 | Disposition: A | Discharge status: Home / Self Care | Payer: Commercial Managed Care - HMO | Attending: Obstetrics and Gynecology | Admitting: Obstetrics and Gynecology

## 2020-09-18 ENCOUNTER — Inpatient Hospital Stay (HOSPITAL_COMMUNITY): Payer: Commercial Managed Care - HMO

## 2020-09-18 DIAGNOSIS — O99824 Streptococcus B carrier state complicating childbirth: Secondary | ICD-10-CM | POA: Diagnosis present

## 2020-09-18 DIAGNOSIS — O3663X0 Maternal care for excessive fetal growth, third trimester, not applicable or unspecified: Secondary | ICD-10-CM | POA: Diagnosis present

## 2020-09-18 DIAGNOSIS — Z3A40 40 weeks gestation of pregnancy: Secondary | ICD-10-CM | POA: Diagnosis not present

## 2020-09-18 DIAGNOSIS — Z885 Allergy status to narcotic agent status: Secondary | ICD-10-CM | POA: Diagnosis not present

## 2020-09-18 DIAGNOSIS — Z20822 Contact with and (suspected) exposure to covid-19: Secondary | ICD-10-CM | POA: Diagnosis present

## 2020-09-18 DIAGNOSIS — O480 Post-term pregnancy: Secondary | ICD-10-CM | POA: Diagnosis present

## 2020-09-18 DIAGNOSIS — O9902 Anemia complicating childbirth: Secondary | ICD-10-CM | POA: Diagnosis present

## 2020-09-18 DIAGNOSIS — O26893 Other specified pregnancy related conditions, third trimester: Secondary | ICD-10-CM | POA: Diagnosis present

## 2020-09-18 DIAGNOSIS — D573 Sickle-cell trait: Secondary | ICD-10-CM | POA: Diagnosis present

## 2020-09-18 LAB — CBC WITH DIFFERENTIAL/PLATELET
Abs Immature Granulocytes: 0.1 10*3/uL — ABNORMAL HIGH (ref 0.00–0.07)
Basophils Absolute: 0 10*3/uL (ref 0.0–0.1)
Basophils Relative: 0 %
Eosinophils Absolute: 0 10*3/uL (ref 0.0–0.5)
Eosinophils Relative: 0 %
HCT: 35.2 % — ABNORMAL LOW (ref 36.0–46.0)
Hemoglobin: 11.7 g/dL — ABNORMAL LOW (ref 12.0–15.0)
Immature Granulocytes: 1 %
Lymphocytes Relative: 12 %
Lymphs Abs: 1.2 10*3/uL (ref 0.7–4.0)
MCH: 26.8 pg (ref 26.0–34.0)
MCHC: 33.2 g/dL (ref 30.0–36.0)
MCV: 80.7 fL (ref 80.0–100.0)
Monocytes Absolute: 1.1 10*3/uL — ABNORMAL HIGH (ref 0.1–1.0)
Monocytes Relative: 11 %
Neutro Abs: 7 10*3/uL (ref 1.7–7.7)
Neutrophils Relative %: 76 %
Platelets: 217 10*3/uL (ref 150–400)
RBC: 4.36 MIL/uL (ref 3.87–5.11)
RDW: 14.5 % (ref 11.5–15.5)
WBC: 9.4 10*3/uL (ref 4.0–10.5)
nRBC: 0.2 % (ref 0.0–0.2)

## 2020-09-18 LAB — CBC
HCT: 34.4 % — ABNORMAL LOW (ref 36.0–46.0)
Hemoglobin: 11.2 g/dL — ABNORMAL LOW (ref 12.0–15.0)
MCH: 26.9 pg (ref 26.0–34.0)
MCHC: 32.6 g/dL (ref 30.0–36.0)
MCV: 82.7 fL (ref 80.0–100.0)
Platelets: 227 10*3/uL (ref 150–400)
RBC: 4.16 MIL/uL (ref 3.87–5.11)
RDW: 14.6 % (ref 11.5–15.5)
WBC: 6.9 10*3/uL (ref 4.0–10.5)
nRBC: 0 % (ref 0.0–0.2)

## 2020-09-18 LAB — TYPE AND SCREEN
ABO/RH(D): O POS
Antibody Screen: NEGATIVE

## 2020-09-18 LAB — RPR: RPR Ser Ql: NONREACTIVE

## 2020-09-18 MED ORDER — LACTATED RINGERS IV SOLN: 500.0000 mL | INTRAVENOUS | Status: DC | PRN

## 2020-09-18 MED ORDER — DIPHENHYDRAMINE HCL 50 MG/ML IJ SOLN: 12.5000 mg | INTRAMUSCULAR | Status: DC | PRN

## 2020-09-18 MED ORDER — TERBUTALINE SULFATE 1 MG/ML IJ SOLN: 0.2500 mg | Freq: Once | INTRAMUSCULAR | Status: DC | PRN

## 2020-09-18 MED ORDER — LIDOCAINE HCL (PF) 1 % IJ SOLN: 30.0000 mL | INTRAMUSCULAR | Status: DC | PRN

## 2020-09-18 MED ORDER — SODIUM CHLORIDE 0.9 % IV SOLN
5.0000 10*6.[IU] | Freq: Once | INTRAVENOUS | Status: AC
  Administered 2020-09-18: 5 10*6.[IU] via INTRAVENOUS
  Filled 2020-09-18: qty 5

## 2020-09-18 MED ORDER — PHENYLEPHRINE 40 MCG/ML (10ML) SYRINGE FOR IV PUSH (FOR BLOOD PRESSURE SUPPORT): 80.0000 ug | PREFILLED_SYRINGE | INTRAVENOUS | Status: DC | PRN

## 2020-09-18 MED ORDER — OXYTOCIN BOLUS FROM INFUSION
333.0000 mL | Freq: Once | INTRAVENOUS | Status: AC
  Administered 2020-09-19: 333 mL via INTRAVENOUS

## 2020-09-18 MED ORDER — LIDOCAINE HCL (PF) 1 % IJ SOLN
INTRAMUSCULAR | Status: DC | PRN
  Administered 2020-09-18: 10 mL via EPIDURAL

## 2020-09-18 MED ORDER — ONDANSETRON HCL 4 MG/2ML IJ SOLN
4.0000 mg | Freq: Four times a day (QID) | INTRAMUSCULAR | Status: DC | PRN
  Administered 2020-09-19: 4 mg via INTRAVENOUS
  Filled 2020-09-18: qty 2

## 2020-09-18 MED ORDER — ACETAMINOPHEN 325 MG PO TABS: 650.0000 mg | ORAL_TABLET | ORAL | Status: DC | PRN

## 2020-09-18 MED ORDER — EPHEDRINE 5 MG/ML INJ: 10.0000 mg | INTRAVENOUS | Status: DC | PRN

## 2020-09-18 MED ORDER — PENICILLIN G POT IN DEXTROSE 60000 UNIT/ML IV SOLN
3.0000 10*6.[IU] | INTRAVENOUS | Status: DC
  Administered 2020-09-18 – 2020-09-19 (×4): 3 10*6.[IU] via INTRAVENOUS
  Filled 2020-09-18 (×6): qty 50

## 2020-09-18 MED ORDER — FENTANYL CITRATE (PF) 100 MCG/2ML IJ SOLN: 50.0000 ug | INTRAMUSCULAR | Status: DC | PRN

## 2020-09-18 MED ORDER — LACTATED RINGERS IV SOLN: 500.0000 mL | Freq: Once | INTRAVENOUS | Status: DC

## 2020-09-18 MED ORDER — OXYTOCIN-SODIUM CHLORIDE 30-0.9 UT/500ML-% IV SOLN
1.0000 m[IU]/min | INTRAVENOUS | Status: DC
  Administered 2020-09-18: 2 m[IU]/min via INTRAVENOUS

## 2020-09-18 MED ORDER — OXYTOCIN-SODIUM CHLORIDE 30-0.9 UT/500ML-% IV SOLN
2.5000 [IU]/h | INTRAVENOUS | Status: DC
Start: 2020-09-18 — End: 2020-09-19
  Administered 2020-09-19: 2.5 [IU]/h via INTRAVENOUS
  Filled 2020-09-18 (×2): qty 500

## 2020-09-18 MED ORDER — SOD CITRATE-CITRIC ACID 500-334 MG/5ML PO SOLN: 30.0000 mL | ORAL | Status: DC | PRN

## 2020-09-18 MED ORDER — LACTATED RINGERS IV SOLN: INTRAVENOUS | Status: DC

## 2020-09-18 MED ORDER — FENTANYL-BUPIVACAINE-NACL 0.5-0.125-0.9 MG/250ML-% EP SOLN
12.0000 mL/h | EPIDURAL | Status: DC | PRN
Start: 2020-09-18 — End: 2020-09-19
  Filled 2020-09-18: qty 250

## 2020-09-18 MED ORDER — SODIUM CHLORIDE (PF) 0.9 % IJ SOLN
INTRAMUSCULAR | Status: DC | PRN
  Administered 2020-09-18: 12 mL/h via EPIDURAL

## 2020-09-18 NOTE — H&P (Addendum)
OBSTETRIC ADMISSION HISTORY AND PHYSICAL Shirley Navarro is a 24 y.o. female G1P0 with IUP at [redacted]w[redacted]d by L/8 presenting for elective IOL. She reports +FMs, No LOF, no VB, no blurry vision, headaches or peripheral edema, and RUQ pain.  She plans on breast and bottle feeding. She currently declines birth control. She received her prenatal [facility]   Dating: By L/8 --->  Estimated Date of Delivery: 09/16/20  Sono:   @[redacted]w[redacted]d , CWD, normal anatomy, cephalic presentation, 2759g, EFW  Prenatal History/Complications:  -sickle cell trait -silent alpha thal carrier - LGA - excessive weight gain  Past Medical History: Past Medical History:  Diagnosis Date  . Asthma     Past Surgical History: Past Surgical History:  Procedure Laterality Date  . NO PAST SURGERIES      Obstetrical History: OB History    Gravida  1   Para      Term      Preterm      AB      Living        SAB      IAB      Ectopic      Multiple      Live Births              Social History Social History   Socioeconomic History  . Marital status: Married    Spouse name: [name]  . Number of children: Not on file  . Years of education: Not on file  . Highest education level: Not on file  Occupational History  . Occupation: 40%  Tobacco Use  . Smoking status: Never Smoker  . Smokeless tobacco: Never Used  Vaping Use  . Vaping Use: Never used  Substance and Sexual Activity  . Alcohol use: Not Currently    Comment: Socially  . Drug use: Not Currently    Types: Marijuana    Comment: Last smoked  April 2021  . Sexual activity: Yes  Other Topics Concern  . Not on file  Social History Narrative   ** Merged History Encounter **       Social Determinants of Health   Financial Resource Strain: Not on file  Food Insecurity: No Food Insecurity  . Worried About May 2021 in the Last Year: Never true  . Ran Out of Food in the Last Year: Never true  Transportation Needs:  No Transportation Needs  . Lack of Transportation (Medical): No  . Lack of Transportation (Non-Medical): No  Physical Activity: Inactive  . Days of Exercise per Week: 0 days  . Minutes of Exercise per Session: 0 min  Stress: Not on file  Social Connections: Not on file    Family History: Family History  Problem Relation Age of Onset  . Healthy Mother   . Healthy Father   . Breast cancer Paternal Aunt   . Diabetes Maternal Grandmother   . Breast cancer Paternal Grandmother     Allergies: Allergies  Allergen Reactions  . Codeine Rash    Medications Prior to Admission  Medication Sig Dispense Refill Last Dose  . cyclobenzaprine (FLEXERIL) 10 MG tablet Take 1 tablet (10 mg total) by mouth every 8 (eight) hours as needed for muscle spasms. 30 tablet 1   . Elastic Bandages & Supports (COMFORT FIT MATERNITY SUPP MED) MISC Wear belt during the day, as needed, and remove at night prior to bedtime. 1 each 0   . metroNIDAZOLE (FLAGYL) 500 MG tablet Take 1 tablet (500  mg total) by mouth 2 (two) times daily. 14 tablet 0   . miconazole (MONISTAT 7) 2 % vaginal cream Place 1 Applicatorful vaginally at bedtime. 45 g 0   . Prenat w/o A-[id number]-[id number]-DHA (CITRANATAL ASSURE) 35-1 & 300 MG tablet Take 1 tablet by mouth daily. 60 each 4    Review of Systems   All systems reviewed and negative except as stated in HPI  Blood pressure 121/75, pulse 95, temperature 98.3 F (36.8 C), temperature source Oral, resp. rate 18, last menstrual period 12/11/2019. General appearance: alert, cooperative and appears stated age Lungs: normal WOB Heart: regular rate and rhythm Abdomen: soft, non-tender Extremities: no sign of DVT Presentation: cephalic by SVE one the floor Fetal monitoringBaseline: 145 bpm, Variability: Good {> 6 bpm), Accelerations: Reactive and Decelerations: Absent Uterine activityFrequency: Every 4-5 minutes SVE: 3/70/vertex per RN  Prenatal labs: ABO, Rh: --/--/O POS (12/10  9163) Antibody: NEG (12/10 8466) Rubella: Immune (05/26 0000) RPR: Non Reactive (09/15 0836)  HBsAg: Negative (05/26 0000)  HIV: Non Reactive (09/15 0836)  GBS: Positive/-- (11/12 1151)  2 hr Glucola wnl Genetic screening  wnl except silent alpha thal carrier, sickle cell trait Anatomy US wnl Prenatal Transfer Tool  Maternal Diabetes: No Genetic Screening: wnl except silent alpha thal carrier, sickle cell trait Maternal Ultrasounds/Referrals: Normal Fetal Ultrasounds or other Referrals:  None Maternal Substance Abuse:  No Significant Maternal Medications:  None Significant Maternal Lab Results: Group B Strep positive  Results for orders placed or performed during the hospital encounter of 09/18/20 (from the past 24 hour(s))  Type and screen   Collection Time: 09/18/20  8:28 AM  Result Value Ref Range   ABO/RH(D) O POS    Antibody Screen NEG    Sample Expiration      09/21/2020,2359 Performed at [facility], [address] [place], [place] [address]   CBC   Collection Time: 09/18/20  8:29 AM  Result Value Ref Range   WBC 6.9 4.0 - 10.5 K/uL   RBC 4.16 3.87 - 5.11 MIL/uL   Hemoglobin 11.2 (L) 12.0 - 15.0 g/dL   HCT 70.1 (L) 77.9 - 39.0 %   MCV 82.7 80.0 - 100.0 fL   MCH 26.9 26.0 - 34.0 pg   MCHC 32.6 30.0 - 36.0 g/dL   RDW 30.0 92.3 - 30.0 %   Platelets 227 150 - 400 K/uL   nRBC 0.0 0.0 - 0.2 %   Patient Active Problem List   Diagnosis Date Noted  . Indication for care or intervention in labor or delivery 09/18/2020  . Group beta Strep positive 08/28/2020  . Sickle cell trait in mother affecting pregnancy (HCC) 06/24/2020  . Supervision of normal first pregnancy, antepartum 06/01/2020   Assessment/Plan:  [name] is a 24 y.o. G1P0 at [redacted]w[redacted]d here for eIOL for post dates.  #Labor: Given favorable cervical exam  #LGA: EFW 97% at 33 weeks. Feels to be 9+ lbs by leopold's. S/d precautions at delivery.  #Sickle Cell Trait  Alpha Thal Carrier: peds  aware #Pain: Per pt request #FWB: Cat 1 #ID: GBS positive >PCN on admit #MOF: both #MOC: patch> discussed progestin only methods since BF #Circ: n/a  Trula Slade, MD  09/18/2020, 10:24 AM  Midwife attestation: I have seen and examined this patient; I agree with above documentation in the resident's note.   PE: Gen: calm comfortable, NAD Resp: normal effort and rate Abd: gravid  ROS, labs, PMH reviewed  Assessment/Plan: [redacted] weeks gestation Labor:  IOL FWB: Cat I GBS: pos Admit to LD IOL with Pitocin Suspected LGA Anticipate SVD  Donette Larry, CNM  09/18/2020, 10:50 AM

## 2020-09-18 NOTE — Plan of Care (Signed)

## 2020-09-18 NOTE — Anesthesia Preprocedure Evaluation (Signed)
Anesthesia Evaluation  Patient identified by MRN, date of birth, ID band Patient awake    Reviewed: Allergy & Precautions, H&P , NPO status , Patient's Chart, lab work & pertinent test results  Airway Mallampati: I       Dental no notable dental hx.    Pulmonary    Pulmonary exam normal        Cardiovascular negative cardio ROS Normal cardiovascular exam     Neuro/Psych negative neurological ROS  negative psych ROS   GI/Hepatic negative GI ROS, Neg liver ROS,   Endo/Other  negative endocrine ROS  Renal/GU negative Renal ROS  negative genitourinary   Musculoskeletal negative musculoskeletal ROS (+)   Abdominal (+) + obese,   Peds  Hematology negative hematology ROS (+)   Anesthesia Other Findings   Reproductive/Obstetrics (+) Pregnancy                             Anesthesia Physical Anesthesia Plan  ASA: II  Anesthesia Plan: Epidural   Post-op Pain Management:    Induction:   PONV Risk Score and Plan:   Airway Management Planned:   Additional Equipment:   Intra-op Plan:   Post-operative Plan:   Informed Consent: I have reviewed the patients History and Physical, chart, labs and discussed the procedure including the risks, benefits and alternatives for the proposed anesthesia with the patient or authorized representative who has indicated his/her understanding and acceptance.       Plan Discussed with:   Anesthesia Plan Comments:         Anesthesia Quick Evaluation

## 2020-09-18 NOTE — Progress Notes (Signed)
Labor Progress Note Laterrica Libman is a 24 y.o. G1P0 at [redacted]w[redacted]d presented for eIOL for post dates.   S: Patient doing well, feeling pressure intermittently. Mother at bedside. Both have no concerns at this time.   O:  BP 120/82   Pulse 96   Temp 98 F (36.7 C) (Oral)   Resp (!) 24   LMP 12/11/2019   SpO2 99%  EFM: 140bpm/moderate variability /+accels and no decels Toco: contractions q2 min   CVE: Dilation: 6 Effacement (%): 90 Cervical Position: Middle Station: 0 Presentation: Vertex Exam by:: April Verkler RN  A&P: 24 y.o. G1P0 [redacted]w[redacted]d here for eIOL for post dates.  #Labor: AROM @1850 . Progressing well. Currently at pitocin 24 milli-units/min, will continue to titrate pitocin to complete. #LGA: EFW 97% at 33 weeks. Feels to be 9+ lbs by leopold's. S/d precautions at delivery.  #Pain: epidural #FWB: category 1 #GBS positive >PCN  Karlei Waldo, DO 11:37 PM

## 2020-09-18 NOTE — Progress Notes (Signed)
Labor Progress Note Ronnett Pullin is a 24 y.o. G1P0 at [redacted]w[redacted]d presented for eIOL for post dates.   S: Patient is resting comfortably in the bed. Feeling contractions intermittently.   O:  BP 121/75 (BP Location: Left Arm)   Pulse 95   Temp 98.3 F (36.8 C) (Oral)   Resp 18   LMP 12/11/2019  EFM: 140/moderate variability /+accels, no decels Toco: 2-4 min  CVE: Dilation: 3 Effacement (%): 70 Cervical Position: Middle Station: -3 Presentation: Vertex Exam by:: Marti Sleigh, RN   A&P: 24 y.o. G1P0 [redacted]w[redacted]d here for eIOL for post dates  #Labor: Progressing well. Will continue to titrate pitocin until patient is feeling more pressure/discomfort.  #LGA: EFW 97% at 33 weeks. Feels to be 9+ lbs by leopold's. S/d precautions at delivery.  #Pain: epidural upon patient request #FWB: cat 1 #GBS positive >PCN  Trula Slade, MD 2:22 PM

## 2020-09-18 NOTE — Progress Notes (Signed)
Labor Progress Note Shirley Navarro is a 24 y.o. G1P0 at [redacted]w[redacted]d presented for eIOL for post dates.   S: Patient is doing great. Bouncing on the ball. Feeling contractions intermittently.   O:  BP (!) 98/57   Pulse 93   Temp 98.3 F (36.8 C) (Oral)   Resp 18   LMP 12/11/2019  EFM: 140/moderate variability /+accels, no decels Toco: 2-4 min  CVE: Dilation: 4.5 Effacement (%): 70 Cervical Position: Middle Station: -1 Presentation: Vertex Exam by:: Dr. Barb Merino  A&P: 24 y.o. G1P0 [redacted]w[redacted]d here for eIOL for post dates  #Labor: Progressing well. AROM @ 1850. Will continue to titrate pitocin to complete. #LGA: EFW 97% at 33 weeks. Feels to be 9+ lbs by leopold's. S/d precautions at delivery.  #Pain: epidural upon patient request #FWB: cat 1 #GBS positive >PCN  Trula Slade, MD 7:32 PM

## 2020-09-18 NOTE — Anesthesia Procedure Notes (Addendum)
Epidural Patient location during procedure: OB Start time: 09/18/2020 8:33 PM End time: 09/18/2020 8:35 PM  Staffing Anesthesiologist: Leilani Able, MD  Preanesthetic Checklist Completed: patient identified, IV checked, site marked, risks and benefits discussed, surgical consent, monitors and equipment checked, pre-op evaluation and timeout performed  Epidural Patient position: sitting Prep: DuraPrep and site prepped and draped Patient monitoring: continuous pulse ox and blood pressure Approach: midline Location: L3-L4 Injection technique: LOR air  Needle:  Needle type: Tuohy  Needle gauge: 17 G Needle length: 9 cm and 9 Needle insertion depth: 7 cm Catheter type: closed end flexible Catheter size: 19 Gauge Catheter at skin depth: 12 cm Test dose: negative and Other  Assessment Events: blood not aspirated, injection not painful, no injection resistance, no paresthesia and negative IV test  Additional Notes Reason for block:procedure for pain

## 2020-09-19 ENCOUNTER — Encounter (HOSPITAL_COMMUNITY): Payer: Self-pay | Admitting: Family Medicine

## 2020-09-19 DIAGNOSIS — O26893 Other specified pregnancy related conditions, third trimester: Secondary | ICD-10-CM | POA: Diagnosis not present

## 2020-09-19 DIAGNOSIS — O480 Post-term pregnancy: Secondary | ICD-10-CM

## 2020-09-19 DIAGNOSIS — Z3A40 40 weeks gestation of pregnancy: Secondary | ICD-10-CM

## 2020-09-19 MED ORDER — COCONUT OIL OIL
1.0000 "application " | TOPICAL_OIL | Status: DC | PRN
  Administered 2020-09-19: 1 via TOPICAL

## 2020-09-19 MED ORDER — TRANEXAMIC ACID-NACL 1000-0.7 MG/100ML-% IV SOLN: 1000.0000 mg | INTRAVENOUS | Status: DC

## 2020-09-19 MED ORDER — TRANEXAMIC ACID-NACL 1000-0.7 MG/100ML-% IV SOLN
INTRAVENOUS | Status: AC
  Administered 2020-09-19: 1000 mg
  Filled 2020-09-19: qty 100

## 2020-09-19 MED ORDER — SIMETHICONE 80 MG PO CHEW: 80.0000 mg | CHEWABLE_TABLET | ORAL | Status: DC | PRN

## 2020-09-19 MED ORDER — MEASLES, MUMPS & RUBELLA VAC IJ SOLR: 0.5000 mL | Freq: Once | INTRAMUSCULAR | Status: DC

## 2020-09-19 MED ORDER — ONDANSETRON HCL 4 MG PO TABS: 4.0000 mg | ORAL_TABLET | ORAL | Status: DC | PRN

## 2020-09-19 MED ORDER — ACETAMINOPHEN 325 MG PO TABS
650.0000 mg | ORAL_TABLET | ORAL | Status: DC | PRN
  Administered 2020-09-19 – 2020-09-20 (×3): 650 mg via ORAL
  Filled 2020-09-19 (×4): qty 2

## 2020-09-19 MED ORDER — DIBUCAINE (PERIANAL) 1 % EX OINT: 1.0000 "application " | TOPICAL_OINTMENT | CUTANEOUS | Status: DC | PRN

## 2020-09-19 MED ORDER — TETANUS-DIPHTH-ACELL PERTUSSIS 5-2.5-18.5 LF-MCG/0.5 IM SUSY: 0.5000 mL | PREFILLED_SYRINGE | Freq: Once | INTRAMUSCULAR | Status: DC

## 2020-09-19 MED ORDER — SENNOSIDES-DOCUSATE SODIUM 8.6-50 MG PO TABS
2.0000 | ORAL_TABLET | ORAL | Status: DC
  Administered 2020-09-19 – 2020-09-20 (×2): 2 via ORAL
  Filled 2020-09-19 (×2): qty 2

## 2020-09-19 MED ORDER — BENZOCAINE-MENTHOL 20-0.5 % EX AERO
1.0000 | INHALATION_SPRAY | CUTANEOUS | Status: DC | PRN
Start: 2020-09-19 — End: 2020-09-20
  Administered 2020-09-19: 1 via TOPICAL
  Filled 2020-09-19: qty 56

## 2020-09-19 MED ORDER — MAGNESIUM HYDROXIDE 400 MG/5ML PO SUSP: 30.0000 mL | ORAL | Status: DC | PRN

## 2020-09-19 MED ORDER — PRENATAL MULTIVITAMIN CH
1.0000 | ORAL_TABLET | Freq: Every day | ORAL | Status: DC
  Administered 2020-09-19 – 2020-09-20 (×2): 1 via ORAL
  Filled 2020-09-19 (×2): qty 1

## 2020-09-19 MED ORDER — DIPHENHYDRAMINE HCL 25 MG PO CAPS
25.0000 mg | ORAL_CAPSULE | Freq: Four times a day (QID) | ORAL | Status: DC | PRN
Start: 2020-09-19 — End: 2020-09-20

## 2020-09-19 MED ORDER — IBUPROFEN 600 MG PO TABS
600.0000 mg | ORAL_TABLET | Freq: Four times a day (QID) | ORAL | Status: DC
  Administered 2020-09-19 – 2020-09-20 (×6): 600 mg via ORAL
  Filled 2020-09-19 (×6): qty 1

## 2020-09-19 MED ORDER — ONDANSETRON HCL 4 MG/2ML IJ SOLN: 4.0000 mg | INTRAMUSCULAR | Status: DC | PRN

## 2020-09-19 MED ORDER — WITCH HAZEL-GLYCERIN EX PADS
1.0000 "application " | MEDICATED_PAD | CUTANEOUS | Status: DC | PRN
  Administered 2020-09-19: 1 via TOPICAL

## 2020-09-19 NOTE — Lactation Note (Addendum)
This note was copied from a baby's chart. Lactation Consultation Note  Patient Name: Shirley Navarro OEVOJ'J Date: 09/19/2020 Reason for consult: Difficult latch;Primapara;1st time breastfeeding  Infant is 40 weeks 16 hours old. Mom states infant latching but not consisently for short periods. Mom does not feel infant is getting enough.   Infant has voided and passed stool x 1 since birth.   Infant in an outfit on arrival, LC had Dad remove the clothes. Mom has short flat nipples with no signs of nipple trauma. Parents denied using a pacifier. Infant able to latch to breasts but will not initiate a suck. LC did hand expression and breast massage only beads of colostrum collected. LC then used manual pump and spoon fed infant 0.3 ml. Infant then able to latch with NS for 10 minutes both breasts but still showing signs of hunger.   LC reviewed with Mom how to use DEBP going over parts, assembly, storage and cleaning. Mom and Dad also taught hand expression.   LC reviewed with parents options to supplement if infant remains inactive at the breast. Mom will pump using DEBP to increase milk supply.   LC reviewed the Donor breast milk consent form with the parents. DBM placed in the fridge. RN, Ty, states she will scan in [facility] if parents decided to use it. Donor breast milk consent form signed and placed in infant's chart.   Plan 1. To feed based on cues 8-12x in 24 hour period no more than 4 hours without an attempt.           2. Mom to offer both breasts, STS and look for signs of milk transfer. If needed, Mom can use the NS.           3. Mom to offer EBM or DBM via following breastfeeding supplementation guidelines and hours after birth. Slow flow nipple provided and paced bottle feeding demonstrated to parents.             4. Mom to pump using DEBP q 3 hours for 15 minutes.   LC left with Mom pumping using DEBP.   LC returned parents just completed spoon feeding 6 ml of EBM from the  pumping session. Infant still cueing 12 ml of DBM given with paced bottle feeding.

## 2020-09-19 NOTE — Discharge Summary (Addendum)
Postpartum Discharge Summary  Date of Service updated     Patient Name: Shirley Navarro DOB: [birth year] MRN: [id number]  Date of admission: 09/18/2020 Delivery date:09/19/2020  Delivering provider: Janet Berlin  Date of discharge: 09/20/2020  Admitting diagnosis: Indication for care or intervention in labor or delivery [O75.9] Intrauterine pregnancy: [redacted]w[redacted]d     Secondary diagnosis:  Active Problems:   Indication for care or intervention in labor or delivery   Vaginal delivery  Additional problems: None    Discharge diagnosis: Term Pregnancy Delivered                                              Post partum procedures:none Augmentation: AROM and Pitocin Complications: None  Hospital course: Induction of Labor With Vaginal Delivery   24 y.o. yo G1P1001 at [redacted]w[redacted]d was admitted to the hospital 09/18/2020 for induction of labor.  Indication for induction: Elective.  Patient had an uncomplicated labor course as follows: Membrane Rupture Time/Date: 6:50 PM ,09/18/2020   Delivery Method:Vaginal, Spontaneous  Episiotomy: None  Lacerations:  1st degree;Labial  Details of delivery can be found in separate delivery note.  Patient had a routine postpartum course. Patient is discharged home 09/20/20.  Newborn Data: Birth date:09/19/2020  Birth time:4:33 AM  Gender:Female  Living status:Living  Apgars:9 ,9  Weight:3975 g   Magnesium Sulfate received: No BMZ received: No Rhophylac:N/A MMR:N/A T-DaP:Given prenatally Flu: No Transfusion:No  Physical exam  Vitals:   09/19/20 1155 09/19/20 1620 09/19/20 2213 09/20/20 0550  BP: 118/61 119/77 120/67 116/76  Pulse: 97 88 80 82  Resp: $Remo'18 16 18 18  'Ryomi$ Temp: 98.5 F (36.9 C) 98.1 F (36.7 C) 97.8 F (36.6 C) 97.8 F (36.6 C)  TempSrc: Oral Oral Axillary Oral  SpO2: 98% 99% 99% 98%   General: alert, cooperative and no distress Lochia: appropriate Uterine Fundus: firm Incision: N/A DVT Evaluation: NA Labs: Lab Results   Component Value Date   WBC 9.4 09/18/2020   HGB 11.7 (L) 09/18/2020   HCT 35.2 (L) 09/18/2020   MCV 80.7 09/18/2020   PLT 217 09/18/2020   CMP Latest Ref Rng & Units 08/25/2020  Glucose 70 - 99 mg/dL 90  BUN 6 - 20 mg/dL 8  Creatinine 0.44 - 1.00 mg/dL 0.80  Sodium 135 - 145 mmol/L 136  Potassium 3.5 - 5.1 mmol/L 4.4  Chloride 98 - 111 mmol/L 102  CO2 22 - 32 mmol/L 24  Calcium 8.9 - 10.3 mg/dL 9.9  Total Protein 6.5 - 8.1 g/dL 7.0  Total Bilirubin 0.3 - 1.2 mg/dL 0.4  Alkaline Phos 38 - 126 U/L 174(H)  AST 15 - 41 U/L 28  ALT 0 - 44 U/L 34   Edinburgh Score: Edinburgh Postnatal Depression Scale Screening Tool 09/19/2020  I have been able to laugh and see the funny side of things. (No Data)     After visit meds:  Allergies as of 09/20/2020      Reactions   Codeine Rash      Medication List    STOP taking these medications   metroNIDAZOLE 500 MG tablet Commonly known as: FLAGYL   miconazole 2 % vaginal cream Commonly known as: MONISTAT 7     TAKE these medications   acetaminophen 325 MG tablet Commonly known as: Tylenol Take 2 tablets (650 mg total) by mouth every 4 (four) hours as  needed (for pain scale < 4).   CitraNatal Assure 35-1 & 300 MG tablet Take 1 tablet by mouth daily.   Comfort Fit Maternity Supp Med Misc Wear belt during the day, as needed, and remove at night prior to bedtime.   cyclobenzaprine 10 MG tablet Commonly known as: FLEXERIL Take 1 tablet (10 mg total) by mouth every 8 (eight) hours as needed for muscle spasms.   ibuprofen 800 MG tablet Commonly known as: ADVIL Take 1 tablet (800 mg total) by mouth every 8 (eight) hours as needed.        Discharge home in stable condition Infant Feeding: Breast Infant Disposition:home with mother Discharge instruction: per After Visit Summary and Postpartum booklet. Activity: Advance as tolerated. Pelvic rest for 6 weeks.  Diet: routine diet Future Appointments:No future  appointments. Follow up Visit:   Please schedule this patient for a In person postpartum visit in 6 weeks with the following provider: Any provider. Additional Postpartum F/U:none  Low risk pregnancy complicated by: n/a Delivery mode:  Vaginal, Spontaneous  Anticipated Birth Control:  Unsure. Patient would like RX for Patch at her PP visit.    09/20/2020 Starr Lake, CNM

## 2020-09-19 NOTE — Lactation Note (Signed)
This note was copied from a baby's chart. Lactation Consultation Note  Patient Name: Shirley Navarro [id number] Date: 09/19/2020 Reason for consult: Initial assessment;1st time breastfeeding   P1, Baby 4 hours old.  Reviewed hand expression and assisted with latching in football hold. Infant latched briefly for approx 5 min.  Demonstrated how to use hand pump to evert nipple. Mother has flat nipples.  Provided shells to wear if desired.  Reviewed basics.  Feed on demand with cues.  Goal 8-12+ times per day after first 24 hrs.  Place baby STS if not cueing.  Mom made aware of O/P services, breastfeeding support groups, community resources, and our phone # for post-discharge questions.     Maternal Data Has patient been taught Hand Expression?: Yes Does the patient have breastfeeding experience prior to this delivery?: No  Feeding Feeding Type: Breast Fed  LATCH Score Latch: Grasps breast easily, tongue down, lips flanged, rhythmical sucking.  Audible Swallowing: A few with stimulation  Type of Nipple: Flat  Comfort (Breast/Nipple): Soft / non-tender  Hold (Positioning): Assistance needed to correctly position infant at breast and maintain latch.  LATCH Score: 7  Interventions Interventions: Breast feeding basics reviewed;Assisted with latch;Skin to skin;Pre-pump if needed;Expressed milk;Shells;Hand pump  Lactation Tools Discussed/Used Tools: Shells;Pump   Consult Status Consult Status: Follow-up Date: 09/19/20 Follow-up type: In-patient    Dahlia Byes [facility] 09/19/2020, 8:49 AM

## 2020-09-19 NOTE — Anesthesia Postprocedure Evaluation (Signed)
Anesthesia Post Note  Patient: Shirley Navarro  Procedure(s) Performed: AN AD HOC LABOR EPIDURAL     Patient location during evaluation: Mother Baby Anesthesia Type: Epidural Level of consciousness: awake and alert, oriented and patient cooperative Pain management: pain level controlled Vital Signs Assessment: post-procedure vital signs reviewed and stable Respiratory status: spontaneous breathing Cardiovascular status: stable Postop Assessment: no headache, epidural receding, patient able to bend at knees and no signs of nausea or vomiting Anesthetic complications: no Comments: Pt. States she is walking.  Pain score 4. Pt. Took pain med immediately prior to interview.    No complications documented.  Last Vitals:  Vitals:   09/19/20 0805 09/19/20 1155  BP: 114/78 118/61  Pulse: 98 97  Resp: 16 18  Temp: 36.9 C 36.9 C  SpO2: 100% 98%    Last Pain:  Vitals:   09/19/20 1412  TempSrc:   PainSc: 5    Pain Goal: Patients Stated Pain Goal: 3 (09/19/20 1412)                 Merrilyn Puma

## 2020-09-20 MED ORDER — ACETAMINOPHEN 325 MG PO TABS: 650.0000 mg | ORAL_TABLET | ORAL | 1 refills | Status: DC | PRN

## 2020-09-20 MED ORDER — IBUPROFEN 800 MG PO TABS: 800.0000 mg | ORAL_TABLET | Freq: Three times a day (TID) | ORAL | 0 refills | Status: DC | PRN

## 2020-09-20 NOTE — Progress Notes (Signed)
CSW received consult for hx of marijuana use.  Referral was screened out due to the following: ~MOB had no documented substance use after initial prenatal visit/+UPT. ~MOB had no positive drug screens after initial prenatal visit/+UPT. ~Baby's UDS is negative.  Please consult CSW if current concerns arise or by MOB's request.  CSW will monitor CDS results and make report to Child Protective Services if warranted.  Aanvi Voyles D. Laykin Rainone, MSW, LCSW Clinical Social Worker [phone] 

## 2020-09-20 NOTE — Progress Notes (Addendum)
POSTPARTUM PROGRESS NOTE  Subjective: Shirley Navarro is a 24 y.o. G1P1001 s/p SVD at [redacted]w[redacted]d.  She reports she doing well. No acute events overnight. She denies any problems with ambulating, voiding or po intake. Denies nausea or vomiting. She has not passed flatus. Pain is well controlled.  Lochia is moderate without clotting.  Objective: Blood pressure 116/76, pulse 82, temperature 97.8 F (36.6 C), temperature source Oral, resp. rate 18, last menstrual period 12/11/2019, SpO2 98 %, unknown if currently breastfeeding.  Physical Exam:  General: alert, cooperative and no distress Chest: normal respiratory effort  Abdomen: soft, non-tender  Uterine Fundus: firm and at level of umbilicus Extremities: No calf swelling or tenderness, no LE edema noted bilaterally   Recent Labs    09/18/20 0829 09/18/20 1907  HGB 11.2* 11.7*  HCT 34.4* 35.2*    Assessment/Plan: Shirley Navarro is a 24 y.o. G1P1001 s/p SVD at [redacted]w[redacted]d for eIOL for post dates.  Routine Postpartum Care: Doing well, pain well-controlled.  -- Continue routine care, lactation support  -- Contraception: Initially desired patch but now undecided. Counseling on options provided.  -- Feeding: both breast and bottle --GBS positive: PCN given   Dispo: Plan for discharge anticipated tomorow.  Reece Leader, DO 09/20/2020 6:05 AM   Attestation of Supervision of Student:  I confirm that I have verified the information documented in the Resident's student's note and that I have also personally reperformed the history, physical exam and all medical decision making activities.  I have verified that all services and findings are accurately documented in this student's note; and I agree with management and plan as outlined in the documentation. I have also made any necessary editorial changes.    Marylene Land, CNM [facility] [employer] 09/20/2020 11:17 AM

## 2020-09-27 ENCOUNTER — Other Ambulatory Visit: Payer: Self-pay | Admitting: Student

## 2020-10-07 ENCOUNTER — Encounter (HOSPITAL_COMMUNITY): Payer: Self-pay | Admitting: Family Medicine

## 2020-10-07 ENCOUNTER — Inpatient Hospital Stay (HOSPITAL_COMMUNITY)
Admission: AD | Admit: 2020-10-07 | Discharge: 2020-10-07 | Disposition: A | Discharge status: Home / Self Care | Payer: 59 | Attending: Family Medicine | Admitting: Family Medicine

## 2020-10-07 ENCOUNTER — Other Ambulatory Visit: Payer: Self-pay

## 2020-10-07 DIAGNOSIS — R102 Pelvic and perineal pain: Secondary | ICD-10-CM | POA: Insufficient documentation

## 2020-10-07 DIAGNOSIS — O8612 Endometritis following delivery: Secondary | ICD-10-CM | POA: Diagnosis not present

## 2020-10-07 DIAGNOSIS — R519 Headache, unspecified: Secondary | ICD-10-CM | POA: Diagnosis not present

## 2020-10-07 DIAGNOSIS — O9089 Other complications of the puerperium, not elsewhere classified: Secondary | ICD-10-CM | POA: Diagnosis present

## 2020-10-07 DIAGNOSIS — Z20822 Contact with and (suspected) exposure to covid-19: Secondary | ICD-10-CM | POA: Diagnosis not present

## 2020-10-07 DIAGNOSIS — R509 Fever, unspecified: Secondary | ICD-10-CM | POA: Insufficient documentation

## 2020-10-07 LAB — RESP PANEL BY RT-PCR (FLU A&B, COVID) ARPGX2
Influenza A by PCR: NEGATIVE
Influenza B by PCR: NEGATIVE
SARS Coronavirus 2 by RT PCR: NEGATIVE

## 2020-10-07 MED ORDER — AMOXICILLIN-POT CLAVULANATE 875-125 MG PO TABS: 1.0000 | ORAL_TABLET | Freq: Two times a day (BID) | ORAL | 0 refills | Status: DC

## 2020-10-07 NOTE — MAU Provider Note (Signed)
Faculty Practice OB/GYN Attending MAU Note  Chief Complaint: Fever and Headache    Event Date/Time   First Provider Initiated Contact with Patient 10/07/20 1144      SUBJECTIVE Shirley Navarro is a 24 y.o. G1P1001  who is postpartum day 61 who presents with fever to 102 last p.m. down to 100 this morning some headache, and pelvic pain.  She denies foul-smelling vaginal discharge.  She is breast-feeding.  She does have some discomfort in the left breast but no erythema.  Past Medical History:  Diagnosis Date  . Asthma    OB History  Gravida Para Term Preterm AB Living  1 1 1     1   SAB IAB Ectopic Multiple Live Births        0 1    # Outcome Date GA Lbr Len/2nd Weight Sex Delivery Anes PTL Lv  1 Term 09/19/20 [redacted]w[redacted]d 07:30 / 02:13 3975 g F Vag-Spont EPI  LIV     Birth Comments: WDL   Past Surgical History:  Procedure Laterality Date  . NO PAST SURGERIES     Social History   Socioeconomic History  . Marital status: Married    Spouse name: [name]  . Number of children: Not on file  . Years of education: Not on file  . Highest education level: Not on file  Occupational History  . Occupation: [redacted]w[redacted]d  Tobacco Use  . Smoking status: Never Smoker  . Smokeless tobacco: Never Used  Vaping Use  . Vaping Use: Never used  Substance and Sexual Activity  . Alcohol use: Not Currently    Comment: Socially  . Drug use: Not Currently    Types: Marijuana    Comment: Last smoked  April 2021  . Sexual activity: Not Currently  Other Topics Concern  . Not on file  Social History Narrative   ** Merged History Encounter **       Social Determinants of Health   Financial Resource Strain: Not on file  Food Insecurity: No Food Insecurity  . Worried About May 2021 in the Last Year: Never true  . Ran Out of Food in the Last Year: Never true  Transportation Needs: No Transportation Needs  . Lack of Transportation (Medical): No  . Lack of Transportation (Non-Medical):  No  Physical Activity: Inactive  . Days of Exercise per Week: 0 days  . Minutes of Exercise per Session: 0 min  Stress: Not on file  Social Connections: Not on file  Intimate Partner Violence: Not At Risk  . Fear of Current or Ex-Partner: No  . Emotionally Abused: No  . Physically Abused: No  . Sexually Abused: No   No current facility-administered medications on file prior to encounter.   Current Outpatient Medications on File Prior to Encounter  Medication Sig Dispense Refill  . acetaminophen (TYLENOL) 325 MG tablet Take 2 tablets (650 mg total) by mouth every 4 (four) hours as needed (for pain scale < 4). 30 tablet 1  . cyclobenzaprine (FLEXERIL) 10 MG tablet Take 1 tablet (10 mg total) by mouth every 8 (eight) hours as needed for muscle spasms. 30 tablet 1  . Prenat w/o A-[id number]-[id number] (CITRANATAL ASSURE) 35-1 & 300 MG tablet Take 1 tablet by mouth daily. 60 each 4  . Elastic Bandages & Supports (COMFORT FIT MATERNITY SUPP MED) MISC Wear belt during the day, as needed, and remove at night prior to bedtime. 1 each 0  . ibuprofen (ADVIL) 800 MG tablet Take 1 tablet (  800 mg total) by mouth every 8 (eight) hours as needed. 30 tablet 0   Allergies  Allergen Reactions  . Codeine Rash    ROS: Pertinent items in HPI  OBJECTIVE Temp 99.5 F (37.5 C) (Oral)   Resp 16  CONSTITUTIONAL: Well-developed, well-nourished female in no acute distress.  HENT:  Normocephalic, atraumatic, External right and left ear normal. EYES: Conjunctivae and EOM are normal. No scleral icterus.  NECK: Normal range of motion, supple, no masses.  Normal thyroid.  SKIN: Skin is warm and dry. No rash noted. Not diaphoretic. No erythema. No pallor. BREAST: Engorged left breast, no erythema or warmth. NEUROLGIC: Alert and oriented to person, place, and time. Normal reflexes, muscle tone coordination. No cranial nerve deficit noted. PSYCHIATRIC: Normal mood and affect. Normal behavior. Normal judgment and  thought content. CARDIOVASCULAR: Normal heart rate noted RESPIRATORY: Effort and breath sounds normal, no problems with respiration noted. ABDOMEN: Soft, normal bowel sounds, no distention noted.  Right-sided uterine tenderness, no rebound or guarding.  MUSCULOSKELETAL: Normal range of motion. No tenderness.  No cyanosis, clubbing, or edema.  2+ distal pulses.  LAB RESULTS Results for orders placed or performed during the hospital encounter of 10/07/20 (from the past 48 hour(s))  Resp Panel by RT-PCR (Flu A&B, Covid) Nasopharyngeal Swab     Status: None   Collection Time: 10/07/20 11:34 AM   Specimen: Nasopharyngeal Swab; Nasopharyngeal(NP) swabs in vial transport medium  Result Value Ref Range   SARS Coronavirus 2 by RT PCR NEGATIVE NEGATIVE    Comment: (NOTE) SARS-CoV-2 target nucleic acids are NOT DETECTED.  The SARS-CoV-2 RNA is generally detectable in upper respiratory specimens during the acute phase of infection. The lowest concentration of SARS-CoV-2 viral copies this assay can detect is 138 copies/mL. A negative result does not preclude SARS-Cov-2 infection and should not be used as the sole basis for treatment or other patient management decisions. A negative result may occur with  improper specimen collection/handling, submission of specimen other than nasopharyngeal swab, presence of viral mutation(s) within the areas targeted by this assay, and inadequate number of viral copies(<138 copies/mL). A negative result must be combined with clinical observations, patient history, and epidemiological information. The expected result is Negative.  Fact Sheet for Patients:  BloggerCourse.com  Fact Sheet for Healthcare Providers:  SeriousBroker.it  This test is no t yet approved or cleared by the [place] FDA and  has been authorized for detection and/or diagnosis of SARS-CoV-2 by FDA under an Emergency Use Authorization  (EUA). This EUA will remain  in effect (meaning this test can be used) for the duration of the COVID-19 declaration under Section 564(b)(1) of the Act, 21 U.S.C.section 360bbb-3(b)(1), unless the authorization is terminated  or revoked sooner.       Influenza A by PCR NEGATIVE NEGATIVE   Influenza B by PCR NEGATIVE NEGATIVE    Comment: (NOTE) The Xpert Xpress SARS-CoV-2/FLU/RSV plus assay is intended as an aid in the diagnosis of influenza from Nasopharyngeal swab specimens and should not be used as a sole basis for treatment. Nasal washings and aspirates are unacceptable for Xpert Xpress SARS-CoV-2/FLU/RSV testing.  Fact Sheet for Patients: BloggerCourse.com  Fact Sheet for Healthcare Providers: SeriousBroker.it  This test is not yet approved or cleared by the [place] FDA and has been authorized for detection and/or diagnosis of SARS-CoV-2 by FDA under an Emergency Use Authorization (EUA). This EUA will remain in effect (meaning this test can be used) for the duration of the COVID-19 declaration  under Section 564(b)(1) of the Act, 21 U.S.C. section 360bbb-3(b)(1), unless the authorization is terminated or revoked.  Performed at [facility], [address] [place], [place] [address]     IMAGING No results found.  MAU COURSE  ASSESSMENT 1. Postpartum endometritis    Suspect this given exam and fever. There does not appear to be an issue with mastitis though engorgement might lead to similar symptoms.  Patient is encouraged to pump breast thoroughly and is allowed to do this here. Will write for 7-day course of Augmentin which would cover endometritis and any breast issue. Patient is negative for Covid and flu She will continue to follow-up at her next postpartum visit. Return with worsening symptoms.  PLAN Discharge home  Follow-up Information    [facility] Follow up.    Specialty: Obstetrics and Gynecology Contact information: [address] [place] [address] [phone]             Allergies as of 10/07/2020      Reactions   Codeine Rash      Medication List    TAKE these medications   acetaminophen 325 MG tablet Commonly known as: Tylenol Take 2 tablets (650 mg total) by mouth every 4 (four) hours as needed (for pain scale < 4).   amoxicillin-clavulanate 875-125 MG tablet Commonly known as: AUGMENTIN Take 1 tablet by mouth every 12 (twelve) hours.   CitraNatal Assure 35-1 & 300 MG tablet Take 1 tablet by mouth daily.   Comfort Fit Maternity Supp Med Misc Wear belt during the day, as needed, and remove at night prior to bedtime.   cyclobenzaprine 10 MG tablet Commonly known as: FLEXERIL Take 1 tablet (10 mg total) by mouth every 8 (eight) hours as needed for muscle spasms.   ibuprofen 800 MG tablet Commonly known as: ADVIL Take 1 tablet (800 mg total) by mouth every 8 (eight) hours as needed.       Evaluation does not show pathology that would require ongoing emergent intervention or inpatient treatment. Patient is hemodynamically stable and mentating appropriately. Discussed findings and plan with patient, who agrees with care plan. All questions answered. Return precautions discussed and outpatient follow up recommendations given.  Reva Bores, MD 10/07/2020 12:53 PM

## 2020-10-07 NOTE — MAU Note (Addendum)
Pt reports developing symptoms 10/06/2020. Started with HA 1400 and fever of 102. Took tylenol 2100 and brought fever down to 100. Pt reports right sided pelvic pain 7/10, constant. Denies SOB. Pt reports BL breast pain w/ decreased milk supply, she is pumping and breast feeding.

## 2020-10-07 NOTE — Discharge Instructions (Signed)
Endometritis  Endometritis is irritation, soreness, or inflammation that affects the lining of the uterus (endometrium). Infection is usually the cause of endometritis. It is important to get treatment to prevent complications. Common complications may include more severe infections and not being able to have children(infertility). What are the causes? This condition may be caused by:  Bacterial infections.  STIs (sexually transmitted infections).  A miscarriage or childbirth, especially after a long labor or cesarean delivery.  Certain gynecological procedures. These may include dilation and curettage (D&C), hysteroscopy, or birth control (contraceptive) insertion.  Tuberculosis (TB). What are the signs or symptoms? Symptoms of this condition include:  Fever.  Lower abdomen (abdominal) pain.  Pelvis (pelvic) pain.  Abnormal vaginal discharge or bleeding.  Abdominal bloating (distention) or swelling.  General discomfort or generally feeling ill.  Discomfort with bowel movements.  Constipation. How is this diagnosed? This condition may be diagnosed based on:  A physical exam, including a pelvic exam.  Tests, such as: ? Blood tests. ? Removal of a sample of endometrial tissue for testing (endometrial biopsy). ? Examining a sample of vaginal discharge under a microscope (wet prep). ? Removal of a sample of fluid from the cervix for testing (cervical culture). ? Surgical examination of the pelvis and abdomen. How is this treated? This condition is treated with:  Antibiotic medicines.  For more severe cases, hospitalization may be needed to give fluids and antibiotics directly into a vein through an IV tube. Follow these instructions at home:  Take over-the-counter and prescription medicines only as told by your health care provider.  Drink enough fluid to keep your urine clear or pale yellow.  Take your antibiotic medicine as told by your health care provider. Do  not stop taking the antibiotic even if you start to feel better.  Do not douche or have sex (including vaginal, oral, and anal sex) until your health care provider approves.  If your endometritis was caused by an STI, do not have sex (including vaginal, oral, and anal sex) until your partner has also been treated for the STI.  Return to your normal activities as told by your health care provider. Ask your health care provider what activities are safe for you.  Keep all follow-up visits as told by your health care provider. This is important. Contact a health care provider if:  You have pain that does not get better with medicine.  You have a fever.  You have pain with bowel movements. Get help right away if:  You have abdominal swelling.  You have abdominal pain that gets worse.  You have bad-smelling vaginal discharge, or an increased amount of vaginal discharge.  You have abnormal vaginal bleeding.  You have nausea and vomiting. Summary  Endometritis affects the lining of the uterus (endometrium) and is usually caused by an infection.  It is important to get treatment to prevent complications.  You have several treatment options for endometritis. Treatment may include antibiotics and IV fluids.  Take your antibiotic medicine as told by your health care provider. Do not stop taking the antibiotic even if you start to feel better.  Do not douche or have sex (including vaginal, oral, and anal sex) until your health care provider approves. This information is not intended to replace advice given to you by your health care provider. Make sure you discuss any questions you have with your health care provider. Document Revised: 09/08/2017 Document Reviewed: 10/11/2016 Elsevier Patient Education  2020 [employer]  

## 2020-10-29 ENCOUNTER — Ambulatory Visit (INDEPENDENT_AMBULATORY_CARE_PROVIDER_SITE_OTHER): Payer: 59 | Admitting: Obstetrics and Gynecology

## 2020-10-29 ENCOUNTER — Encounter: Payer: Self-pay | Admitting: Obstetrics and Gynecology

## 2020-10-29 ENCOUNTER — Other Ambulatory Visit: Payer: Self-pay

## 2020-10-29 DIAGNOSIS — Z30011 Encounter for initial prescription of contraceptive pills: Secondary | ICD-10-CM

## 2020-10-29 DIAGNOSIS — R102 Pelvic and perineal pain: Secondary | ICD-10-CM

## 2020-10-29 MED ORDER — SLYND 4 MG PO TABS
1.0000 | ORAL_TABLET | Freq: Every day | ORAL | 11 refills | Status: DC
Start: 2020-10-29 — End: 2020-12-14

## 2020-10-29 NOTE — Patient Instructions (Signed)
Drospirenone tablets (contraception) O que  este medicamento? A DROSPIRENONA  um contraceptivo oral (plula anticoncepcional). O produto contm um hormnio feminino do tipo progestina.  usado para prevenir a Environmental education officer. Este medicamento pode ser usado para outros propsitos; em caso de dvidas, pergunte ao seu profissional de sade ou farmacutico. NOMES DE MARCAS COMUNS: Slynd O que devo dizer a meu profissional de sade antes de tomar este medicamento? Precisam saber se voc tem algum dos seguintes problemas ou estados de sade:  sangramento vaginal anormal  doena das glndulas [address] vasos sanguneos ou cogulos (trombose)  cncer de mama, do colo do tero, de endomtrio, de ovrio, de fgado ou de tero  diabetes  doenas cardacas ou ataque do corao recente  nveis elevados de potssio no sangue  doenas renais  doenas hepticas  depresso  enxaqueca  derrame  reao estranha ou alergia  drospirenona ou s progestinas  reao estranha ou alergia a outros medicamentos, alimentos, corantes ou conservantes  est grvida ou tentando Dance movement psychotherapist  est [employer] devo usar este medicamento? Tome este medicamento por via oral. Para diminuir o enjoo, este medicamento pode ser ingerido junto com comida. Siga as instrues na embalagem ou na bula. Tome este medicamento no mesmo horrio todos os dias e na ordem indicada na embalagem. No tome este medicamento com frequncia maior do que a indicada. Um folheto informativo lhe ser dado com cada compra do medicamento. Leia este folheto atentamente cada vez que receb-lo. O folheto pode mudar com frequncia. Fale com seu pediatra a respeito do uso deste medicamento em crianas. Pode ser preciso tomar alguns cuidados especiais. Este [employer] j foi usado em meninas que comearam a Armed forces training and education officer. Superdosagem: Se achar que tomou uma superdosagem deste medicamento, entre em contato imediatamente com o [facility]  [place] [facility] ou v a [facility]. OBSERVAO: Este medicamento  s para voc. No compartilhe este medicamento com outras pessoas. E se eu deixar de tomar uma dose? Se perder uma dose, tome-a assim que possvel e consulte o folheto informativo para o paciente que acompanha o medicamento para mais informaes. Se deixar de tomar mais de uma plula, este medicamento pode no ser to [employer], e voc pode precisar usar outro mtodo anticoncepcional. O que pode interagir com este medicamento? No tome este medicamento com nenhum dos seguintes:  atazanavir/cobicistate  bosentana  fosamprenavir Este medicamento tambm pode interagir com os seguintes remdios:  aprepitanto  barbitricos, como o fenobarbital ou a primidona  carbamazepina  alguns antibiticos, como claritromicina, rifampicina, rifabutina e rifapentina  alguns medicamentos antivirais contra a infeco pelo HIV ou hepatite  certos diurticos, como amilorida, espironolactona ou triantereno  alguns medicamentos para infeces fngicas, como griseofulvina, cetoconazol, itraconazol e voriconazol  alguns medicamentos para hipertenso ou problemas cardacos  ciclosporina  felbamato  heparina  medicamentos para diabetes  modafinila  AINEs, medicamentos analgsicos e anti-inflamatrios, como ibuprofeno, naproxeno  oxcarbazepina  fenitona  suplementos de potssio  rufinamida  erva-de-so-joo  topiramato Esta lista pode no descrever todas as interaes possveis. D ao seu profissional de sade uma lista de todos os medicamentos, ervas medicinais, remdios de venda livre, ou suplementos alimentares que voc [place]. Diga tambm se voc fuma, bebe, ou [place] drogas ilcitas. Alguns destes podem interagir com o seu medicamento. Ao que devo ficar atento quando estiver [employer] medicamento? Consulte seu mdico ou profissional de sade para realizar um acompanhamento regular Education officer, museum. Voc  precisar fazer exames de mama, plvicos e de Papan[place] periodicamente enquanto estiver [employer]  medicamento. Voc precisar fazer exames de sangue peridicos enquanto estiver [employer]. Se tiver motivos para acreditar que est grvida, pare imediatamente de usar este medicamento e entre em contato com seu mdico ou profissional de sade. Este medicamento no protege contra uma possvel infeco pelo HIV, AIDS ou outras doenas sexualmente transmissveis. Se for passar por uma cirurgia eletiva, pode ser preciso suspender este medicamento. Pea orientao ao seu mdico ou profissional de sade. Que efeitos colaterais posso sentir aps usar este medicamento? Efeitos colaterais que devem ser informados ao seu mdico ou profissional de sade o mais rpido possvel:  reaes alrgicas, como erupo na pele, coceira, urticria, ou inchao do rosto, dos lbios ou da lngua  alteraes nos tecidos mamrios ou secreo  deprimido  forte dor, inchao ou sensibilidade no abdome  sinais e sintomas de um cogulo sanguneo, tais como dor no peito; falta de ar; dor, inchao ou calor na perna  sinais e sintomas de [employer] de potssio, como fraqueza muscular, dores no peito ou batimento cardaco acelerado e irregular  sinais e sintomas de leso heptica, tais como urina amarela escura ou marrom; mal-estar geral ou sintomas de gripe; fezes de cor clara; perda de apetite; nuseas; dor no lado superior direito da barriga; cansao ou fraqueza fora do comum; amarelecimento [employer] ou da pele  sinais e sintomas de derrame, como alteraes na viso; confuso; dificuldade para falar ou entender os outros; cefaleia grave; dormncia sbita ou fraqueza na face, no brao ou na perna; dificuldade para andar; tontura; perda de equilbrio ou coordenao.  sangramento vaginal anormal  fraqueza ou cansao fora do comum Efeitos colaterais que normalmente no precisam de cuidados mdicos  (avise ao mdico ou profissional de sade se persistirem ou forem incmodos):  acne  seios sensveis  dor de cabea  clicas menstruais  enjoo  ganho de peso Esta lista pode no descrever todos os efeitos colaterais possveis. Para mais orientaes sobre efeitos colaterais, consulte o seu mdico. Voc pode relatar a ocorrncia de efeitos colaterais  FDA pelo telefone [phone]. Onde devo guardar meu medicamento? Mal [place] fora do [employer]. Conservar em temperatura ambiente, entre 20 e 25 degreesC [phone] e 77 degreesF). Descartar qualquer medicamento no utilizado aps a data de validade impressa no rtulo ou embalagem. OBSERVAO: Este folheto  um resumo. Pode no cobrir todas as informaes possveis. Se tiver dvidas a respeito deste medicamento, fale com seu mdico, farmacutico ou profissional de sade.  2021 Elsevier/Gold Standard (2018-08-06 00:00:00)

## 2020-10-29 NOTE — Progress Notes (Signed)
Post Partum Visit Note  Shirley Navarro is a 25 y.o. G49P1001 female who presents for a postpartum visit. She is 6 weeks postpartum following a normal spontaneous vaginal delivery.  I have fully reviewed the prenatal and intrapartum course. The delivery was at 40.3 gestational weeks.  Anesthesia: epidural. Postpartum course has been complicated by uterine infection. She has completed her 2 weeks of antibiotics. Baby is doing well. Baby is feeding by bottle - Gerber Gentle. Bleeding no bleeding. Bowel function is normal. Bladder function is normal. Patient is sexually active. Contraception method is none. Postpartum depression screening: positive: score 6.   The pregnancy intention screening data noted above was reviewed. Potential methods of contraception were discussed. The patient elected to proceed with Oral Contraceptive.    Edinburgh Postnatal Depression Scale - 10/29/20 1008      Edinburgh Postnatal Depression Scale:  In the Past 7 Days   I have been able to laugh and see the funny side of things. 0    I have looked forward with enjoyment to things. 0    I have blamed myself unnecessarily when things went wrong. 1    I have been anxious or worried for no good reason. 2    I have felt scared or panicky for no good reason. 2    Things have been getting on top of me. 0    I have been so unhappy that I have had difficulty sleeping. 0    I have felt sad or miserable. 1    I have been so unhappy that I have been crying. 0    The thought of harming myself has occurred to me. 0    Edinburgh Postnatal Depression Scale Total 6           The following portions of the patient's history were reviewed and updated as appropriate: allergies, current medications, past family history, past medical history, past social history, past surgical history and problem list.  Review of Systems Constitutional: negative Eyes: negative Ears, nose, mouth, throat, and face: negative Respiratory:  negative Cardiovascular: negative Gastrointestinal: negative Genitourinary:negative Integument/breast: negative Hematologic/lymphatic: negative Musculoskeletal:negative Neurological: negative Behavioral/Psych: positive for feelings of being overwhelmed at times Endocrine: negative Allergic/Immunologic: negative    Objective:  BP 114/75 (BP Location: Left Arm, Patient Position: Sitting, Cuff Size: Normal)   Pulse 96   Temp 98 F (36.7 C) (Oral)   Ht 5\' 5"  (1.651 m)   Wt 172 lb 3.2 oz (78.1 kg)   Breastfeeding No   BMI 28.66 kg/m    General:  alert, cooperative and no distress   Breasts:  inspection negative, no nipple discharge or bleeding, no masses or nodularity palpable  Lungs: clear to auscultation bilaterally  Heart:  regular rate and rhythm, S1, S2 normal, no murmur, click, rub or gallop  Abdomen: soft, non-tender; bowel sounds normal; no masses,  no organomegaly, some RLQ tenderness with palpation   Vulva:  not evaluated  Vagina: not evaluated  Cervix:  not evaluated  Corpus: not examined  Adnexa:  not evaluated  Rectal Exam: Not performed.        Assessment:  Encounter for postpartum visit - Normal postpartum exam. Pap smear not done at today's visit.   Encounter for initial prescription of contraceptive pills  - 1 month sample of Slynd given - Rx for Drospirenone (SLYND) 4 MG TABS  Pelvic pain - Advised to continue using ibuprofen 600 mg every 6 hours prn pain - Advised that pain could be  from ovarian cyst; which may improve with OCP use. - Advised to call, if pelvic pain does not improve or resolve after the 1st month of use.   Plan:   Essential components of care per ACOG recommendations:  1.  Mood and well being: Patient with positive depression (score = 6) screening today. She reports occasional feelings of being overwhelmed and feeling frustrated while job seeking. Reviewed local resources for support.  - Patient does not use tobacco.  - hx of drug  use? No    2. Infant care and feeding:  -Patient currently breastmilk feeding? No  -Social determinants of health (SDOH) reviewed in EPIC. No concerns  3. Sexuality, contraception and birth spacing - Patient does not want a pregnancy in the next year.  Desired family size is unsure of number of children.  - Reviewed forms of contraception in tiered fashion. Patient desired oral progesterone-only contraceptive today.   - Discussed birth spacing of 18 months  4. Sleep and fatigue -Encouraged family/partner/community support of 4 hrs of uninterrupted sleep to help with mood and fatigue  5. Physical Recovery  - Discussed patients delivery and complications - Patient had a 1st degree left labia laceration with repair, perineal healing reviewed. Patient expressed understanding - Patient has urinary incontinence? No - Patient is safe to resume physical and sexual activity  6.  Health Maintenance - Last pap smear done 07/01/2019 and was normal.  7. No Chronic Disease - PCP follow up  Raelyn Mora, CNM [facility] [employer], [facility]

## 2020-10-30 ENCOUNTER — Telehealth: Payer: Self-pay | Admitting: Family Medicine

## 2020-10-30 NOTE — Telephone Encounter (Signed)
Pt reports getting the pfizer vaccine.  Pt states she just got her 2nd vaccine last week.

## 2020-11-11 ENCOUNTER — Other Ambulatory Visit (HOSPITAL_COMMUNITY)
Admission: RE | Admit: 2020-11-11 | Discharge: 2020-11-11 | Disposition: A | Discharge status: Home / Self Care | Payer: 59 | Source: Ambulatory Visit | Attending: Advanced Practice Midwife | Admitting: Advanced Practice Midwife

## 2020-11-11 ENCOUNTER — Encounter: Payer: Self-pay | Admitting: Advanced Practice Midwife

## 2020-11-11 ENCOUNTER — Other Ambulatory Visit: Payer: Self-pay

## 2020-11-11 ENCOUNTER — Ambulatory Visit (INDEPENDENT_AMBULATORY_CARE_PROVIDER_SITE_OTHER): Payer: 59 | Admitting: Advanced Practice Midwife

## 2020-11-11 VITALS — BP 126/81 | HR 68 | Temp 98.1°F | Ht 65.0 in | Wt 172.4 lb

## 2020-11-11 DIAGNOSIS — R102 Pelvic and perineal pain: Secondary | ICD-10-CM

## 2020-11-11 LAB — POCT URINALYSIS DIPSTICK (MANUAL)
Leukocytes, UA: NEGATIVE
Nitrite, UA: NEGATIVE
Poct Bilirubin: NEGATIVE
Poct Blood: NEGATIVE
Poct Glucose: NORMAL mg/dL
Poct Ketones: NEGATIVE
Poct Protein: NEGATIVE mg/dL
Poct Urobilinogen: NORMAL mg/dL
Spec Grav, UA: 1.025 (ref 1.010–1.025)
pH, UA: 5.5 (ref 5.0–8.0)

## 2020-11-11 MED ORDER — NAPROXEN 500 MG PO TABS: 500.0000 mg | ORAL_TABLET | Freq: Two times a day (BID) | ORAL | 0 refills | Status: DC

## 2020-11-11 NOTE — Progress Notes (Signed)
  Subjective:     Patient ID: Shirley Navarro, female   DOB: 11-06-1995, 25 y.o.   MRN: [id number]  Shirley Navarro is a 25 y.o. G1P1001 who is here today with right sided lower abdominal pain. She has had this pain since her visit on 10/29/20. Provider rx POPs for contraception and to see if this helped. Patient states that she started bleeding on 11/08/2020 and at that time the pain became worse.   Pelvic Pain The patient's primary symptoms include pelvic pain. This is a new problem. Episode onset: 10/29/2020. The problem occurs intermittently. The problem has been gradually worsening (now constant ). The problem affects the right side. Associated symptoms include dysuria (some pressure when urinating ). Pertinent negatives include no chills, constipation, fever, nausea or vomiting. The vaginal discharge was normal. The vaginal bleeding is typical of menses. She has tried acetaminophen for the symptoms. The treatment provided mild relief. She is sexually active. She uses oral contraceptives for contraception.     Review of Systems  Constitutional: Negative for chills and fever.  Gastrointestinal: Negative for constipation, nausea and vomiting.  Genitourinary: Positive for dysuria (some pressure when urinating ) and pelvic pain.       Objective:   Physical Exam Vitals and nursing note reviewed. Exam conducted with a chaperone present.  Constitutional:      General: She is not in acute distress. HENT:     Head: Normocephalic.  Eyes:     Pupils: Pupils are equal, round, and reactive to light.  Cardiovascular:     Rate and Rhythm: Normal rate.  Pulmonary:     Effort: Pulmonary effort is normal.  Abdominal:     Palpations: Abdomen is soft.     Tenderness: There is no abdominal tenderness.  Genitourinary:    Comments:  External: no lesion Vagina: small amount of bloody discharge  Cervix: pink, smooth, no CMT Uterus: NSSC Adnexa: some tenderness in the right adnexa   Skin:     General: Skin is warm and dry.  Neurological:     Mental Status: She is alert and oriented to person, place, and time.  Psychiatric:        Mood and Affect: Mood normal.        Behavior: Behavior normal.        Assessment:     1. Pelvic pain     Plan:     - GC/CT - Urine Culture - Pelvic US ordered  - Naproxen BID for pain management    Thressa Sheller DNP, CNM  11/11/20  11:26 AM

## 2020-11-13 LAB — URINE CULTURE

## 2020-11-15 LAB — CERVICOVAGINAL ANCILLARY ONLY
Bacterial Vaginitis (gardnerella): NEGATIVE
Candida Glabrata: NEGATIVE
Candida Vaginitis: POSITIVE — AB
Chlamydia: NEGATIVE
Comment: NEGATIVE
Comment: NEGATIVE
Comment: NEGATIVE
Comment: NEGATIVE
Comment: NEGATIVE
Comment: NORMAL
Neisseria Gonorrhea: NEGATIVE
Trichomonas: NEGATIVE

## 2020-11-15 MED ORDER — FLUCONAZOLE 150 MG PO TABS: 150.0000 mg | ORAL_TABLET | Freq: Once | ORAL | 0 refills | Status: AC

## 2020-11-15 NOTE — Addendum Note (Signed)
Addended by: Thressa Sheller D on: 11/15/2020 10:09 PM   Modules accepted: Orders

## 2020-11-24 ENCOUNTER — Encounter: Payer: Self-pay | Admitting: *Deleted

## 2020-11-24 ENCOUNTER — Ambulatory Visit: Payer: 59 | Attending: Advanced Practice Midwife

## 2020-12-14 ENCOUNTER — Other Ambulatory Visit (INDEPENDENT_AMBULATORY_CARE_PROVIDER_SITE_OTHER): Payer: 59 | Admitting: Obstetrics and Gynecology

## 2020-12-14 DIAGNOSIS — Z30016 Encounter for initial prescription of transdermal patch hormonal contraceptive device: Secondary | ICD-10-CM

## 2020-12-14 MED ORDER — NORELGESTROMIN-ETH ESTRADIOL 150-35 MCG/24HR TD PTWK: 1.0000 | MEDICATED_PATCH | TRANSDERMAL | 12 refills | Status: DC

## 2020-12-29 ENCOUNTER — Ambulatory Visit (INDEPENDENT_AMBULATORY_CARE_PROVIDER_SITE_OTHER): Payer: 59 | Admitting: *Deleted

## 2020-12-29 ENCOUNTER — Other Ambulatory Visit: Payer: Self-pay

## 2020-12-29 VITALS — BP 137/82 | HR 76 | Temp 97.9°F | Wt 177.0 lb

## 2020-12-29 DIAGNOSIS — Z3042 Encounter for surveillance of injectable contraceptive: Secondary | ICD-10-CM

## 2020-12-29 MED ORDER — MEDROXYPROGESTERONE ACETATE 150 MG/ML IM SUSP
150.0000 mg | INTRAMUSCULAR | Status: DC
  Administered 2020-12-29 – 2022-06-16 (×5): 150 mg via INTRAMUSCULAR

## 2020-12-29 NOTE — Progress Notes (Signed)
   Subjective:  Pt in for Depo Provera injection. Last sexual encounter has been over 2 weeks. LMP 12/27/20, currently still bleeding, per patient slightly heavier than usual.  Objective: Need for contraception. No unusual complaints.    Assessment: Pt tolerated Depo injection. Depo given Left Deltoid  Plan:  Next injection due June 7-21, 2022.    Clovis Pu, RN

## 2021-03-16 ENCOUNTER — Ambulatory Visit: Payer: Medicaid Other

## 2021-03-17 ENCOUNTER — Other Ambulatory Visit: Payer: Self-pay | Admitting: *Deleted

## 2021-03-17 DIAGNOSIS — Z3042 Encounter for surveillance of injectable contraceptive: Secondary | ICD-10-CM

## 2021-03-17 MED ORDER — MEDROXYPROGESTERONE ACETATE 150 MG/ML IM SUSP: 150.0000 mg | INTRAMUSCULAR | 3 refills | Status: DC

## 2021-03-17 NOTE — Progress Notes (Signed)
Rx for depo provera to pharmacy.  Clovis Pu, RN'

## 2021-03-22 ENCOUNTER — Ambulatory Visit: Payer: Medicaid Other

## 2021-03-23 ENCOUNTER — Ambulatory Visit: Payer: 59

## 2021-03-29 ENCOUNTER — Ambulatory Visit (INDEPENDENT_AMBULATORY_CARE_PROVIDER_SITE_OTHER): Payer: 59 | Admitting: *Deleted

## 2021-03-29 ENCOUNTER — Ambulatory Visit: Payer: 59

## 2021-03-29 ENCOUNTER — Encounter: Payer: Self-pay | Admitting: *Deleted

## 2021-03-29 ENCOUNTER — Other Ambulatory Visit: Payer: Self-pay

## 2021-03-29 DIAGNOSIS — Z3042 Encounter for surveillance of injectable contraceptive: Secondary | ICD-10-CM | POA: Diagnosis not present

## 2021-03-29 NOTE — Progress Notes (Signed)
   Subjective:  Pt in for Depo Provera injection.    Objective: Need for contraception. No unusual complaints.    Assessment: Pt tolerated Depo injection. Depo given Left Deltoid.   Plan:  Next injection due 9/5-9/19/22.    Clovis Pu, RN

## 2021-05-18 ENCOUNTER — Encounter (HOSPITAL_COMMUNITY): Payer: Self-pay

## 2021-05-18 ENCOUNTER — Ambulatory Visit (HOSPITAL_COMMUNITY)
Admission: EM | Admit: 2021-05-18 | Discharge: 2021-05-18 | Disposition: A | Discharge status: Home / Self Care | Payer: Managed Care, Other (non HMO) | Attending: Family Medicine | Admitting: Family Medicine

## 2021-05-18 ENCOUNTER — Other Ambulatory Visit: Payer: Self-pay

## 2021-05-18 DIAGNOSIS — U071 COVID-19: Secondary | ICD-10-CM | POA: Insufficient documentation

## 2021-05-18 DIAGNOSIS — R0602 Shortness of breath: Secondary | ICD-10-CM | POA: Diagnosis present

## 2021-05-18 DIAGNOSIS — J4521 Mild intermittent asthma with (acute) exacerbation: Secondary | ICD-10-CM | POA: Insufficient documentation

## 2021-05-18 DIAGNOSIS — J069 Acute upper respiratory infection, unspecified: Secondary | ICD-10-CM | POA: Diagnosis not present

## 2021-05-18 DIAGNOSIS — J9801 Acute bronchospasm: Secondary | ICD-10-CM | POA: Diagnosis not present

## 2021-05-18 LAB — SARS CORONAVIRUS 2 (TAT 6-24 HRS): SARS Coronavirus 2: POSITIVE — AB

## 2021-05-18 MED ORDER — METHYLPREDNISOLONE ACETATE 40 MG/ML IJ SUSP
60.0000 mg | Freq: Once | INTRAMUSCULAR | Status: AC
  Administered 2021-05-18: 60 mg via INTRAMUSCULAR

## 2021-05-18 MED ORDER — ALBUTEROL SULFATE HFA 108 (90 BASE) MCG/ACT IN AERS
INHALATION_SPRAY | RESPIRATORY_TRACT | Status: AC
  Filled 2021-05-18: qty 6.7

## 2021-05-18 MED ORDER — METHYLPREDNISOLONE ACETATE 40 MG/ML IJ SUSP
INTRAMUSCULAR | Status: AC
  Filled 2021-05-18: qty 2

## 2021-05-18 MED ORDER — METHYLPREDNISOLONE ACETATE 80 MG/ML IJ SUSP: 60.0000 mg | Freq: Once | INTRAMUSCULAR | Status: DC

## 2021-05-18 MED ORDER — ALBUTEROL SULFATE HFA 108 (90 BASE) MCG/ACT IN AERS
2.0000 | INHALATION_SPRAY | Freq: Once | RESPIRATORY_TRACT | Status: AC
  Administered 2021-05-18: 2 via RESPIRATORY_TRACT

## 2021-05-18 NOTE — Discharge Instructions (Addendum)
We gave you a shot of steroids today.  Please continue using over-the-counter medications for symptom management.  Use your albuterol inhaler as needed.  If you are having to use this more than every 4-6 hours you probably should be reevaluated to consider additional medications.  We will contact you if your COVID-19 test is positive.  If you have any worsening symptoms including chest pain, shortness of breath, nausea, vomiting you need to be reevaluated.

## 2021-05-18 NOTE — ED Provider Notes (Signed)
[facility]    CSN: [id number] Arrival date & time: 05/18/21  7829      History   Chief Complaint Chief Complaint  Patient presents with  . URI    HPI Shirley Navarro is a 25 y.o. female.   Patient presents today with 3-day history of URI symptoms.  Reports nonproductive cough, sinus pressure, congestion, headache, shortness of breath, low-grade fever.  She denies any chest pain, nausea, vomiting, body aches, dizziness, syncope.  She has not tried any over-the-counter medication for symptom management.  She has been vaccinated against COVID-19.  Has not had COVID-19 in the past.  Does report household sick contacts with similar symptoms.  She has a history of asthma and allergies reports current symptoms are similar to previous flares of this condition.  She does not have an albuterol inhaler available she is not currently followed by PCP.  She denies any recent antibiotic use.  She has no concern for pregnancy.   Past Medical History:  Diagnosis Date  . Asthma     Patient Active Problem List   Diagnosis Date Noted  . Vaginal delivery 09/19/2020  . Indication for care or intervention in labor or delivery 09/18/2020  . Group beta Strep positive 08/28/2020  . Sickle cell trait in mother affecting pregnancy (HCC) 06/24/2020  . Supervision of normal first pregnancy, antepartum 06/01/2020    Past Surgical History:  Procedure Laterality Date  . NO PAST SURGERIES      OB History     Gravida  1   Para  1   Term  1   Preterm      AB      Living  1      SAB      IAB      Ectopic      Multiple  0   Live Births  1            Home Medications    Prior to Admission medications   Medication Sig Start Date End Date Taking? Authorizing Provider  acetaminophen (TYLENOL) 325 MG tablet Take 2 tablets (650 mg total) by mouth every 4 (four) hours as needed (for pain scale < 4). 09/20/20   Marylene Land, CNM  amoxicillin-clavulanate  (AUGMENTIN) 875-125 MG tablet Take 1 tablet by mouth every 12 (twelve) hours. Patient not taking: No sig reported 10/07/20   Reva Bores, MD  cyclobenzaprine (FLEXERIL) 10 MG tablet Take 1 tablet (10 mg total) by mouth every 8 (eight) hours as needed for muscle spasms. Patient not taking: No sig reported 08/25/20   Edd Arbour R, CNM  Elastic Bandages & Supports (COMFORT FIT MATERNITY SUPP MED) MISC Wear belt during the day, as needed, and remove at night prior to bedtime. Patient not taking: No sig reported 07/31/20   Gerrit Heck, CNM  ibuprofen (ADVIL) 800 MG tablet Take 1 tablet (800 mg total) by mouth every 8 (eight) hours as needed. Patient not taking: No sig reported 09/20/20   Marylene Land, CNM  medroxyPROGESTERone (DEPO-PROVERA) 150 MG/ML injection Inject 1 mL (150 mg total) into the muscle every 3 (three) months. 03/17/21   Raelyn Mora, CNM  naproxen (NAPROSYN) 500 MG tablet Take 1 tablet (500 mg total) by mouth 2 (two) times daily with a meal. Patient not taking: Reported on 03/29/2021 11/11/20   Armando Reichert, CNM  Prenat w/o A-FeCbGl-DSS-FA-DHA (CITRANATAL ASSURE) 35-1 & 300 MG tablet Take 1 tablet by mouth daily. Patient not taking: Reported  on 03/29/2021 06/24/20   Sharyon Cable, CNM    Family History Family History  Problem Relation Age of Onset  . Healthy Mother   . Healthy Father   . Breast cancer Paternal Aunt   . Diabetes Maternal Grandmother   . Breast cancer Paternal Grandmother     Social History Social History   Tobacco Use  . Smoking status: Never  . Smokeless tobacco: Never  Vaping Use  . Vaping Use: Never used  Substance Use Topics  . Alcohol use: Not Currently    Comment: Socially  . Drug use: Not Currently    Types: Marijuana    Comment: Last smoked  April 2021     Allergies   Codeine   Review of Systems Review of Systems  Constitutional:  Positive for activity change and fever (improved). Negative for appetite  change and fatigue.  HENT:  Positive for congestion, postnasal drip, sinus pressure and sore throat. Negative for sneezing.   Respiratory:  Positive for cough and shortness of breath.   Cardiovascular:  Negative for chest pain.  Gastrointestinal:  Negative for abdominal pain, diarrhea, nausea and vomiting.  Musculoskeletal:  Negative for arthralgias and myalgias.  Neurological:  Positive for headaches. Negative for dizziness and light-headedness.    Physical Exam Triage Vital Signs ED Triage Vitals  Enc Vitals Group     BP 05/18/21 0941 140/75     Pulse Rate 05/18/21 0941 94     Resp 05/18/21 0941 17     Temp 05/18/21 0941 98.9 F (37.2 C)     Temp Source 05/18/21 0941 Oral     SpO2 05/18/21 0941 96 %     Weight --      Height --      Head Circumference --      Peak Flow --      Pain Score 05/18/21 0942 6     Pain Loc --      Pain Edu? --      Excl. in [place]? --    No data found.  Updated Vital Signs BP 140/75 (BP Location: Left Arm)   Pulse 94   Temp 98.9 F (37.2 C) (Oral)   Resp 17   SpO2 96%   Visual Acuity Right Eye Distance:   Left Eye Distance:   Bilateral Distance:    Right Eye Near:   Left Eye Near:    Bilateral Near:     Physical Exam Vitals reviewed.  Constitutional:      General: She is awake. She is not in acute distress.    Appearance: Normal appearance. She is normal weight. She is not ill-appearing.     Comments: Very pleasant female appears stated age in no acute distress sitting comfortably in exam room  HENT:     Head: Normocephalic and atraumatic.     Right Ear: Tympanic membrane, ear canal and external ear normal. Tympanic membrane is not erythematous or bulging.     Left Ear: Tympanic membrane, ear canal and external ear normal. Tympanic membrane is not erythematous or bulging.     Nose:     Right Sinus: Maxillary sinus tenderness present. No frontal sinus tenderness.     Left Sinus: Maxillary sinus tenderness present. No frontal sinus  tenderness.     Mouth/Throat:     Pharynx: Uvula midline. Posterior oropharyngeal erythema present. No oropharyngeal exudate.     Comments: Moderate erythema and drainage in posterior oropharynx Cardiovascular:     Rate and Rhythm: Normal  rate and regular rhythm.     Heart sounds: Normal heart sounds, S1 normal and S2 normal. No murmur heard. Pulmonary:     Effort: Pulmonary effort is normal.     Breath sounds: Wheezing present. No rhonchi or rales.     Comments: Scattered wheezing throughout lung fields that improved with albuterol Lymphadenopathy:     Head:     Right side of head: No submental, submandibular or tonsillar adenopathy.     Left side of head: No submental, submandibular or tonsillar adenopathy.     Cervical: No cervical adenopathy.  Psychiatric:        Behavior: Behavior is cooperative.     UC Treatments / Results  Labs (all labs ordered are listed, but only abnormal results are displayed) Labs Reviewed  SARS CORONAVIRUS 2 (TAT 6-24 HRS)    EKG   Radiology No results found.  Procedures Procedures (including critical care time)  Medications Ordered in UC Medications  albuterol (VENTOLIN HFA) 108 (90 Base) MCG/ACT inhaler 2 puff (2 puffs Inhalation Given 05/18/21 1049)  methylPREDNISolone acetate (DEPO-MEDROL) injection 60 mg (60 mg Intramuscular Given 05/18/21 1056)    Initial Impression / Assessment and Plan / UC Course  I have reviewed the triage vital signs and the nursing notes.  Pertinent labs & imaging results that were available during my care of the patient were reviewed by me and considered in my medical decision making (see chart for details).      Discussed likely viral etiology of symptoms given short duration.  Patient has been symptomatic for several days so was not tested for flu.  COVID test is pending.  Given bronchospasm on exam she was given albuterol in clinic with improvement of symptoms.  She was given 60 mg of Depo-Medrol.   Recommended she use over-the-counter medications including Mucinex and Flonase for additional symptom relief.  She was given work excuse note with current return to work guidelines from the [employer].  Discussed alarm symptoms that warrant emergent evaluation.  Strict return precautions given to which patient expressed understanding.  Final Clinical Impressions(s) / UC Diagnoses   Final diagnoses:  Viral URI with cough  Shortness of breath  Bronchospasm  Mild intermittent asthma with acute exacerbation     Discharge Instructions      We gave you a shot of steroids today.  Please continue using over-the-counter medications for symptom management.  Use your albuterol inhaler as needed.  If you are having to use this more than every 4-6 hours you probably should be reevaluated to consider additional medications.  We will contact you if your COVID-19 test is positive.  If you have any worsening symptoms including chest pain, shortness of breath, nausea, vomiting you need to be reevaluated.     ED Prescriptions   None    PDMP not reviewed this encounter.   Jeani Hawking, PA-C 05/18/21 1056

## 2021-05-18 NOTE — ED Triage Notes (Signed)
Pt presents with congestion and sinus pressure X 3 days.

## 2021-05-19 ENCOUNTER — Telehealth (HOSPITAL_COMMUNITY): Payer: Self-pay

## 2021-05-19 NOTE — Telephone Encounter (Signed)
Patient called with test results and educated on the use of OTC medication to treat sx per discharge orders. Pt aware that she can return to be re evaluated if sx worsen. Pt verbalized understanding.

## 2021-05-27 IMAGING — US US OB < 14 WEEKS - US OB TV
1 series · 15 of 28 positions shown · non-contrast
Comparison: None.

CLINICAL DATA: 23-year-old pregnant female with pelvic pain.

EXAM:
OBSTETRIC <14 WK US AND TRANSVAGINAL OB US
TECHNIQUE: Both transabdominal and transvaginal ultrasound examinations were
performed for complete evaluation of the gestation as well as the
maternal uterus, adnexal regions, and pelvic cul-de-sac.
Transvaginal technique was performed to assess early pregnancy.

[Series 1: us ob < 14 weeks - us ob tv · 79 acquisitions, 15 frames shown]
[im 1/79]
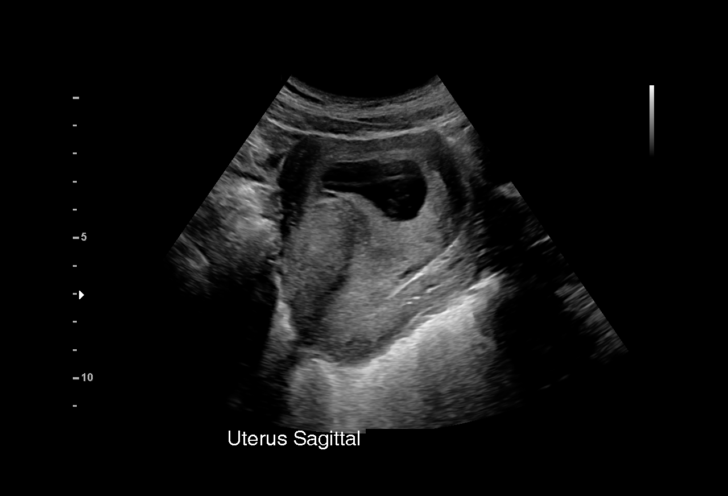
[im 6/79]
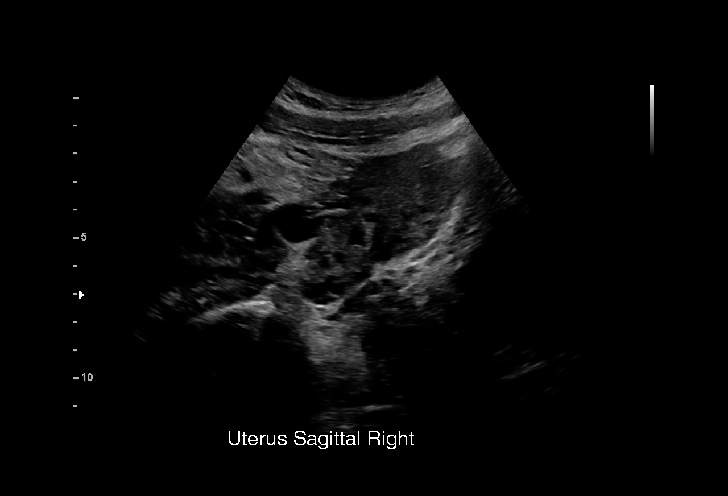
[im 12/79]
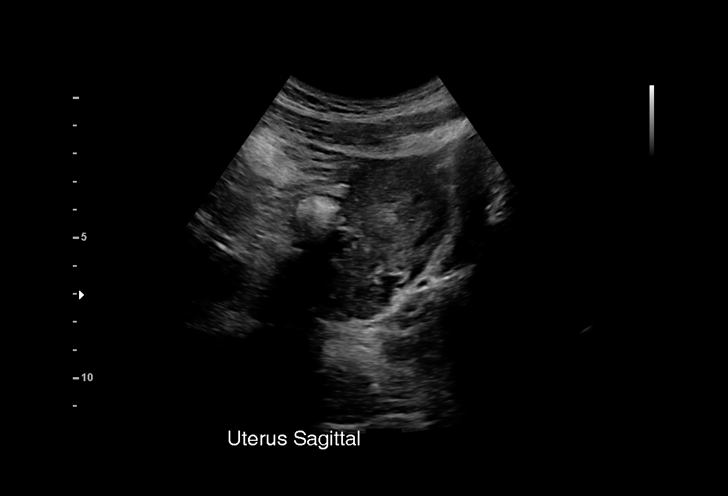
[im 18/79]
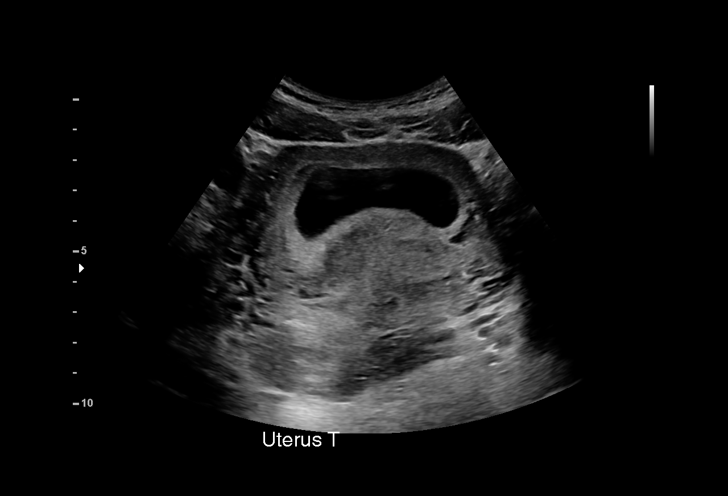
[im 24/79]
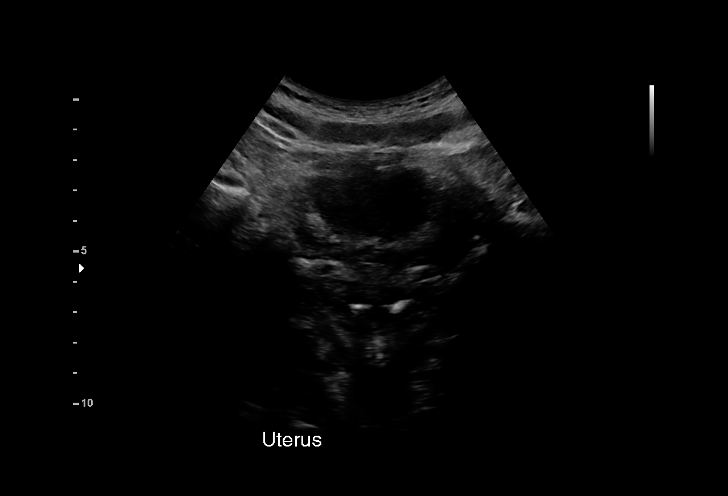
[im 29/79]
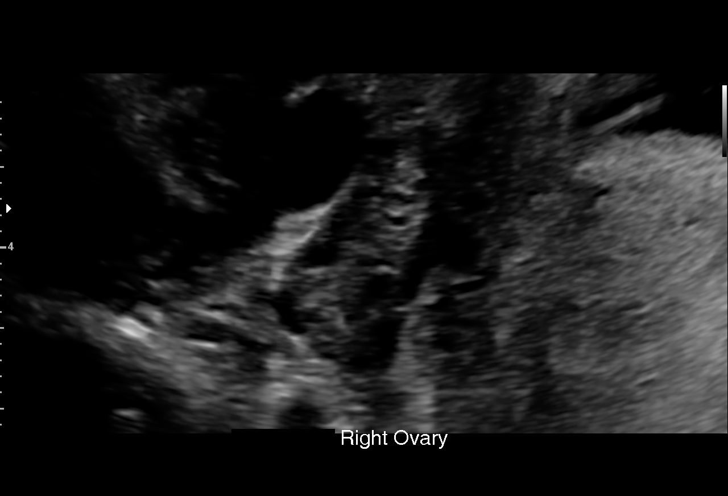
[im 35/79]
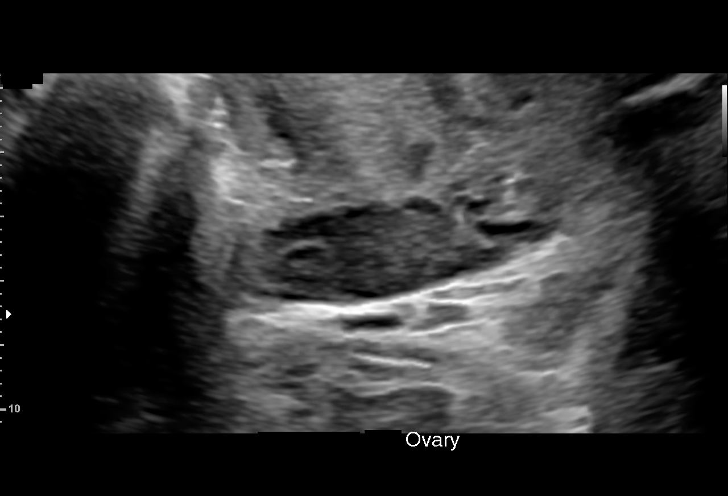
[im 41/79]
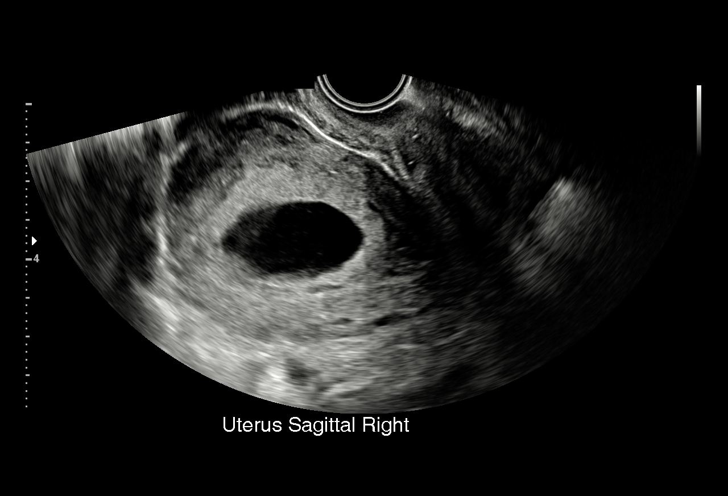
[im 44/79]
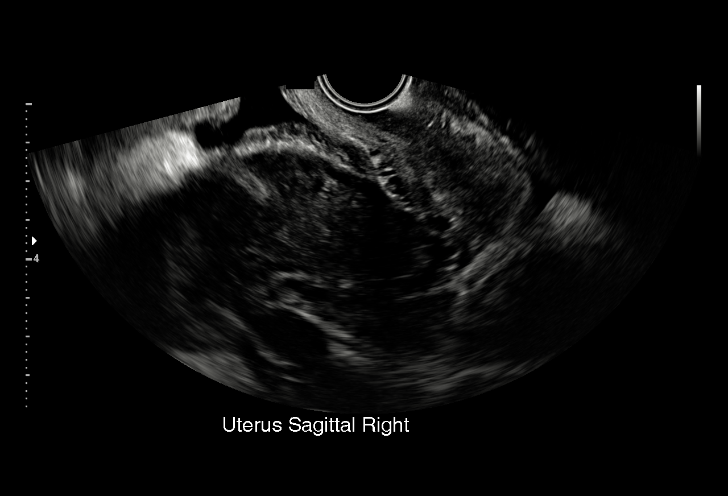
[im 50/79]
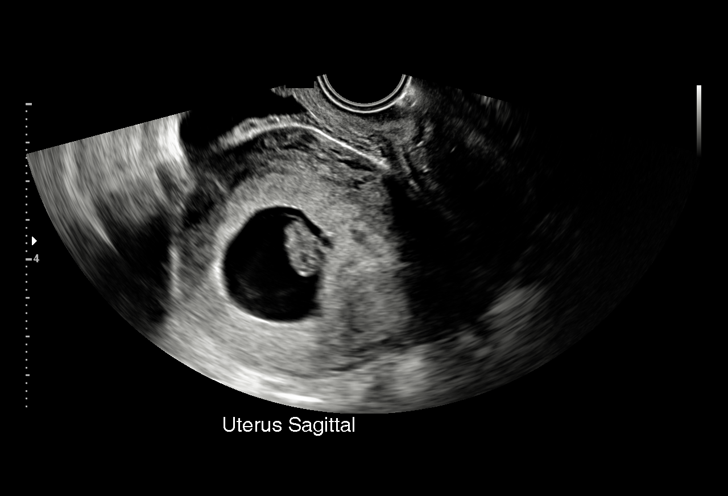
[im 55/79]
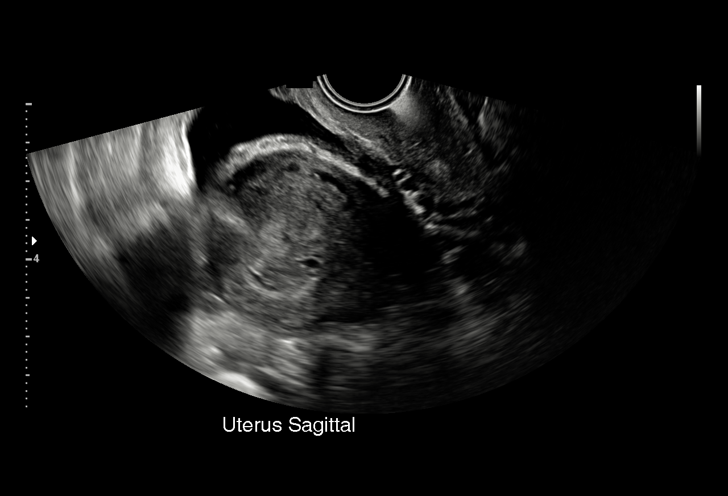
[im 61/79]
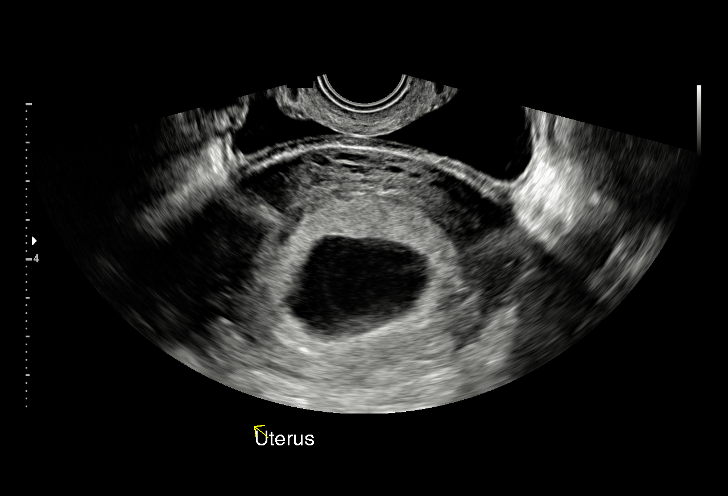
[im 67/79]
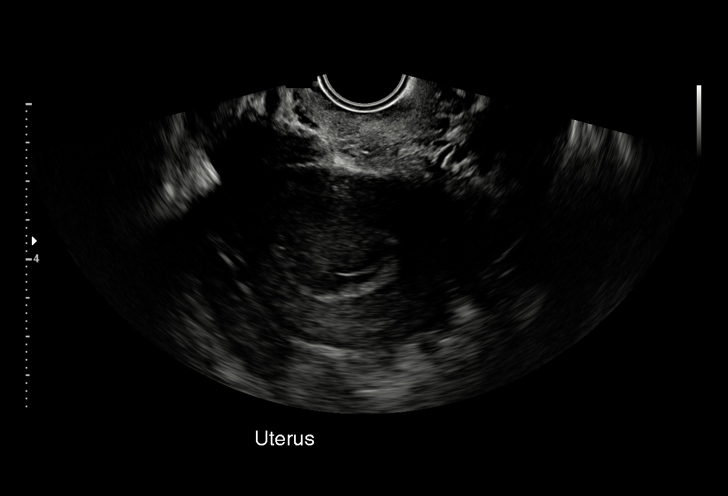
[im 73/79]
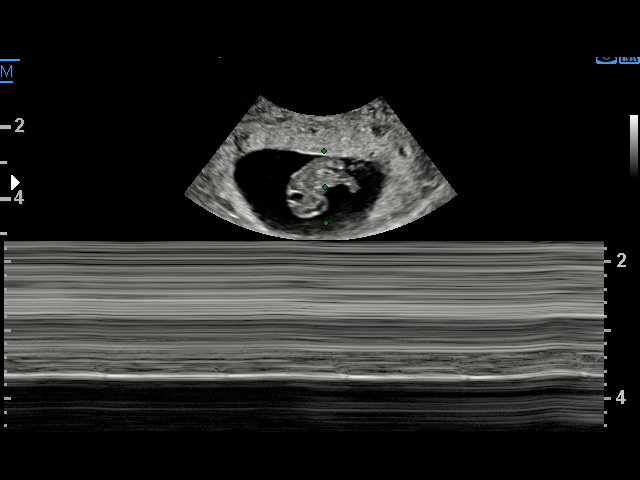
[im 79/79]
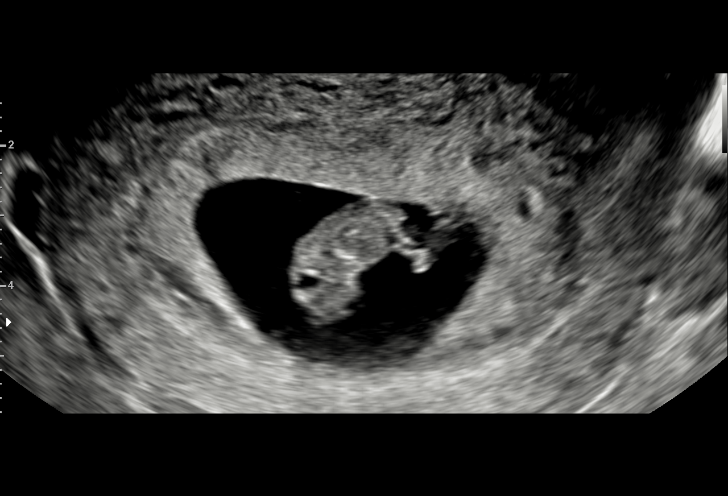

[15 of 28 positions shown; findings below may reference images not displayed]

FINDINGS: Intrauterine gestational sac: Single

Yolk sac:  Visualized.

Embryo:  Visualized.

Cardiac Activity: Visualized.

Heart Rate: 176 bpm

CRL:  21.5 mm   8 w   5 d                  US EDC: 09/13/2020

Subchorionic hemorrhage:  None visualized.

Maternal uterus/adnexae: The ovaries bilaterally are unremarkable.

A trace amount of free pelvic fluid is noted.
IMPRESSION: 1. Single living intrauterine gestation with estimated gestational
age of 8 weeks 5 days.
2. No subchorionic hemorrhage.
3. Trace free pelvic fluid.

## 2021-06-14 ENCOUNTER — Ambulatory Visit: Payer: 59

## 2021-06-15 ENCOUNTER — Telehealth: Payer: Self-pay | Admitting: *Deleted

## 2021-06-15 ENCOUNTER — Other Ambulatory Visit: Payer: Self-pay

## 2021-06-15 ENCOUNTER — Ambulatory Visit (INDEPENDENT_AMBULATORY_CARE_PROVIDER_SITE_OTHER): Payer: Managed Care, Other (non HMO) | Admitting: *Deleted

## 2021-06-15 VITALS — BP 121/69 | HR 78 | Temp 98.3°F | Ht 65.0 in | Wt 175.6 lb

## 2021-06-15 DIAGNOSIS — Z3042 Encounter for surveillance of injectable contraceptive: Secondary | ICD-10-CM

## 2021-06-15 NOTE — Telephone Encounter (Signed)
Patient's mother called stating patient was seen this AM for "birth control shot". Patient is now having symptoms of dizziness, nausea, lightheaded. Advise mom that patient has received same medication and she was not late for injection. Advise to take her to urgent care.  Clovis Pu, RN

## 2021-06-15 NOTE — Progress Notes (Signed)
   Subjective:  Pt in for Depo Provera injection.    Objective: Need for contraception. No unusual complaints.    Assessment: Pt tolerated Depo injection. Depo given Right Deltoid  Plan:  Next injection due 08/31/2021-09/14/2021.    Clovis Pu, RN

## 2021-08-31 ENCOUNTER — Other Ambulatory Visit: Payer: Self-pay

## 2021-08-31 ENCOUNTER — Ambulatory Visit (INDEPENDENT_AMBULATORY_CARE_PROVIDER_SITE_OTHER): Payer: Managed Care, Other (non HMO) | Admitting: *Deleted

## 2021-08-31 VITALS — BP 126/80 | HR 71 | Temp 98.0°F | Ht 65.0 in | Wt 176.4 lb

## 2021-08-31 DIAGNOSIS — Z3042 Encounter for surveillance of injectable contraceptive: Secondary | ICD-10-CM | POA: Diagnosis not present

## 2021-08-31 NOTE — Progress Notes (Signed)
   Subjective:  Pt in for Depo Provera injection.    Objective: Need for contraception. No unusual complaints.    Assessment: Pt tolerated Depo injection. Depo given Left Deltoid.   Plan:  Next injection due 11/16/21-11/30/2021. Patient is due for annual, will complete with next Depo injection.    Clovis Pu, RN

## 2021-10-23 ENCOUNTER — Encounter (HOSPITAL_COMMUNITY): Payer: Self-pay | Admitting: *Deleted

## 2021-10-23 ENCOUNTER — Ambulatory Visit (HOSPITAL_COMMUNITY)
Admission: EM | Admit: 2021-10-23 | Discharge: 2021-10-23 | Disposition: A | Discharge status: Home / Self Care | Payer: Managed Care, Other (non HMO) | Attending: Physician Assistant | Admitting: Physician Assistant

## 2021-10-23 ENCOUNTER — Other Ambulatory Visit: Payer: Self-pay

## 2021-10-23 DIAGNOSIS — J45909 Unspecified asthma, uncomplicated: Secondary | ICD-10-CM | POA: Insufficient documentation

## 2021-10-23 DIAGNOSIS — Z20822 Contact with and (suspected) exposure to covid-19: Secondary | ICD-10-CM | POA: Diagnosis not present

## 2021-10-23 DIAGNOSIS — J069 Acute upper respiratory infection, unspecified: Secondary | ICD-10-CM | POA: Insufficient documentation

## 2021-10-23 LAB — SARS CORONAVIRUS 2 (TAT 6-24 HRS): SARS Coronavirus 2: NEGATIVE

## 2021-10-23 MED ORDER — PROMETHAZINE-DM 6.25-15 MG/5ML PO SYRP: 5.0000 mL | ORAL_SOLUTION | Freq: Four times a day (QID) | ORAL | 0 refills | Status: DC | PRN

## 2021-10-23 MED ORDER — ALBUTEROL SULFATE HFA 108 (90 BASE) MCG/ACT IN AERS: 1.0000 | INHALATION_SPRAY | Freq: Four times a day (QID) | RESPIRATORY_TRACT | 0 refills | Status: AC | PRN

## 2021-10-23 NOTE — Discharge Instructions (Signed)
Use inhaler as needed Recommend Mucinex and Flonase COVID test pending Drink plenty of fluids, rest

## 2021-10-23 NOTE — ED Triage Notes (Signed)
Pt reports starting Thursday she has been congested with a runny nose,cough  and SHOB.

## 2021-10-23 NOTE — ED Provider Notes (Signed)
[place]    CSN: [id number] Arrival date & time: 10/23/21  1014      History   Chief Complaint Chief Complaint  Patient presents with   Cough   Nasal Congestion   Shortness of Breath    HPI Shirley Navarro is a 26 y.o. female.   Pt complains of congestion, rhinorrhea, and cough that started three days ago.  She reports a history of asthma and has experienced intermittent shortness of breath.  She has an albuterol inhaler, but has run out.  She denies fever, chills, body aches, n/v/d.  She has tried no other medications for her sx.  She has had her COVID vaccine and flu shot. Denies sick contacts.    Past Medical History:  Diagnosis Date   Asthma     Patient Active Problem List   Diagnosis Date Noted   Vaginal delivery 09/19/2020   Indication for care or intervention in labor or delivery 09/18/2020   Group beta Strep positive 08/28/2020   Sickle cell trait in mother affecting pregnancy ([place]) 06/24/2020   Supervision of normal first pregnancy, antepartum 06/01/2020    Past Surgical History:  Procedure Laterality Date   NO PAST SURGERIES      OB History     Gravida  1   Para  1   Term  1   Preterm      AB      Living  1      SAB      IAB      Ectopic      Multiple  0   Live Births  1            Home Medications    Prior to Admission medications   Medication Sig Start Date End Date Taking? Authorizing Provider  albuterol (VENTOLIN HFA) 108 (90 Base) MCG/ACT inhaler Inhale 1-2 puffs into the lungs every 6 (six) hours as needed for wheezing or shortness of breath. 10/23/21  Yes Ward, Lenise Arena, PA-C  promethazine-dextromethorphan (PROMETHAZINE-DM) 6.25-15 MG/5ML syrup Take 5 mLs by mouth 4 (four) times daily as needed for cough. 10/23/21  Yes Ward, Lenise Arena, PA-C  acetaminophen (TYLENOL) 325 MG tablet Take 2 tablets (650 mg total) by mouth every 4 (four) hours as needed (for pain scale < 4). Patient not taking: Reported on  06/15/2021 09/20/20   Starr Lake, CNM  amoxicillin-clavulanate (AUGMENTIN) 875-125 MG tablet Take 1 tablet by mouth every 12 (twelve) hours. Patient not taking: Reported on 10/29/2020 10/07/20   Donnamae Jude, MD  cyclobenzaprine (FLEXERIL) 10 MG tablet Take 1 tablet (10 mg total) by mouth every 8 (eight) hours as needed for muscle spasms. Patient not taking: Reported on 10/29/2020 08/25/20   Gabriel Carina, CNM  Elastic Bandages & Supports (COMFORT FIT MATERNITY SUPP MED) MISC Wear belt during the day, as needed, and remove at night prior to bedtime. Patient not taking: Reported on 10/29/2020 07/31/20   Gavin Pound, CNM  ibuprofen (ADVIL) 800 MG tablet Take 1 tablet (800 mg total) by mouth every 8 (eight) hours as needed. Patient not taking: Reported on 10/29/2020 09/20/20   Starr Lake, CNM  medroxyPROGESTERone (DEPO-PROVERA) 150 MG/ML injection Inject 1 mL (150 mg total) into the muscle every 3 (three) months. 03/17/21   Laury Deep, CNM  naproxen (NAPROSYN) 500 MG tablet Take 1 tablet (500 mg total) by mouth 2 (two) times daily with a meal. Patient not taking: Reported on 03/29/2021 11/11/20   Norman Herrlich,  Heather D, CNM  Prenat w/o A-FeCbGl-DSS-FA-DHA (CITRANATAL ASSURE) 35-1 & 300 MG tablet Take 1 tablet by mouth daily. Patient not taking: Reported on 03/29/2021 06/24/20   Lajean Manes, CNM    Family History Family History  Problem Relation Age of Onset   Healthy Mother    Healthy Father    Breast cancer Paternal Aunt    Diabetes Maternal Grandmother    Breast cancer Paternal Grandmother     Social History Social History   Tobacco Use   Smoking status: Never   Smokeless tobacco: Never  Vaping Use   Vaping Use: Never used  Substance Use Topics   Alcohol use: Not Currently    Comment: Socially   Drug use: Not Currently    Types: Marijuana    Comment: Last smoked  April 2021     Allergies   Codeine   Review of Systems Review of Systems   Constitutional:  Negative for chills and fever.  HENT:  Positive for congestion and rhinorrhea. Negative for ear pain and sore throat.   Eyes:  Negative for pain and visual disturbance.  Respiratory:  Positive for cough. Negative for shortness of breath.   Cardiovascular:  Negative for chest pain and palpitations.  Gastrointestinal:  Negative for abdominal pain, nausea and vomiting.  Genitourinary:  Negative for dysuria and hematuria.  Musculoskeletal:  Negative for arthralgias and back pain.  Skin:  Negative for color change and rash.  Neurological:  Negative for seizures and syncope.  All other systems reviewed and are negative.   Physical Exam Triage Vital Signs ED Triage Vitals  Enc Vitals Group     BP 10/23/21 1046 109/74     Pulse Rate 10/23/21 1046 89     Resp 10/23/21 1046 18     Temp 10/23/21 1046 97.8 F (36.6 C)     Temp src --      SpO2 10/23/21 1046 98 %     Weight --      Height --      Head Circumference --      Peak Flow --      Pain Score 10/23/21 1044 0     Pain Loc --      Pain Edu? --      Excl. in [place]? --    No data found.  Updated Vital Signs BP 109/74    Pulse 89    Temp 97.8 F (36.6 C)    Resp 18    LMP 09/09/2021    SpO2 98%   Visual Acuity Right Eye Distance:   Left Eye Distance:   Bilateral Distance:    Right Eye Near:   Left Eye Near:    Bilateral Near:     Physical Exam Vitals and nursing note reviewed.  Constitutional:      General: She is not in acute distress.    Appearance: She is well-developed.  HENT:     Head: Normocephalic and atraumatic.  Eyes:     Conjunctiva/sclera: Conjunctivae normal.  Cardiovascular:     Rate and Rhythm: Normal rate and regular rhythm.     Heart sounds: No murmur heard. Pulmonary:     Effort: Pulmonary effort is normal. No respiratory distress.     Breath sounds: Normal breath sounds.  Abdominal:     Palpations: Abdomen is soft.     Tenderness: There is no abdominal tenderness.   Musculoskeletal:        General: No swelling.     Cervical  back: Neck supple.  Skin:    General: Skin is warm and dry.     Capillary Refill: Capillary refill takes less than 2 seconds.  Neurological:     Mental Status: She is alert.  Psychiatric:        Mood and Affect: Mood normal.     UC Treatments / Results  Labs (all labs ordered are listed, but only abnormal results are displayed) Labs Reviewed  SARS CORONAVIRUS 2 (TAT 6-24 HRS)    EKG   Radiology No results found.  Procedures Procedures (including critical care time)  Medications Ordered in UC Medications - No data to display  Initial Impression / Assessment and Plan / UC Course  I have reviewed the triage vital signs and the nursing notes.  Pertinent labs & imaging results that were available during my care of the patient were reviewed by me and considered in my medical decision making (see chart for details).     URI, COVID pending.  Lungs clear, vitals normal, stable for discharge. Albuterol inhaler refilled.  Supportive treatment discussed.  Return precautions discussed.  Final Clinical Impressions(s) / UC Diagnoses   Final diagnoses:  Viral upper respiratory tract infection     Discharge Instructions      Use inhaler as needed Recommend Mucinex and Flonase COVID test pending Drink plenty of fluids, rest     ED Prescriptions     Medication Sig Dispense Auth. Provider   albuterol (VENTOLIN HFA) 108 (90 Base) MCG/ACT inhaler Inhale 1-2 puffs into the lungs every 6 (six) hours as needed for wheezing or shortness of breath. 1 each Ward, Lenise Arena, PA-C   promethazine-dextromethorphan (PROMETHAZINE-DM) 6.25-15 MG/5ML syrup Take 5 mLs by mouth 4 (four) times daily as needed for cough. 118 mL Ward, Lenise Arena, PA-C      PDMP not reviewed this encounter.   Ward, Lenise Arena, PA-C 10/23/21 1116

## 2021-11-05 ENCOUNTER — Ambulatory Visit (INDEPENDENT_AMBULATORY_CARE_PROVIDER_SITE_OTHER): Payer: Managed Care, Other (non HMO)

## 2021-11-05 ENCOUNTER — Other Ambulatory Visit: Payer: Self-pay

## 2021-11-05 VITALS — BP 114/75 | HR 83 | Temp 98.0°F | Ht 65.0 in | Wt 171.0 lb

## 2021-11-05 DIAGNOSIS — N63 Unspecified lump in unspecified breast: Secondary | ICD-10-CM

## 2021-11-05 NOTE — Progress Notes (Signed)
[facility]  GYNECOLOGY PROGRESS NOTE  History:  26 y.o. G1P1001 presents to [facility] office today for problem gyn visit. She reports 3 weeks ago she started having pain in right breast. This past Monday, she noticed a lump under the right breast. It was tender to touch. She feels like the lump is "getting better now". She denies skin changes, nipple discharge, or erythema to area. Denies dietary changes. No breast cancer history in first degree relatives. Is taking Depo for contraception. Last injection was in November.  The following portions of the patient's history were reviewed and updated as appropriate: allergies, current medications, past family history, past medical history, past social history, past surgical history and problem list. Last pap smear on 06/2019 was normal.  Health Maintenance Due  Topic Date Due   COVID-19 Vaccine (1) Never done   HPV VACCINES (1 - 2-dose series) Never done   INFLUENZA VACCINE  Never done     Review of Systems:  Pertinent items are noted in HPI.   Objective:  Physical Exam Blood pressure 114/75, pulse 83, temperature 98 F (36.7 C), temperature source Oral, height 5\' 5"  (1.651 m), weight 171 lb (77.6 kg), not currently breastfeeding. VS reviewed, nursing note reviewed,  Constitutional: well developed, well nourished, no distress HEENT: normocephalic CV: normal rate Pulm/chest wall: normal effort Breast Exam: right breast normal without mass, skin or nipple changes or axillary nodes, right breast is slightly tender to palpation, left breast normal without mass, skin or nipple changes or axillary nodes Abdomen: soft Neuro: alert and oriented x 3 Skin: warm, dry Psych: affect normal Pelvic exam: deferred  Assessment & Plan:  1. Mass of breast, unspecified laterality - No masses or lumps identified on exam - Will order of right breast  - US BREAST COMPLETE UNI RIGHT INC AXILLA; Future    [place], CNM 11/05/21 11:53  AM

## 2021-11-17 ENCOUNTER — Other Ambulatory Visit: Payer: Self-pay

## 2021-11-17 ENCOUNTER — Other Ambulatory Visit (HOSPITAL_COMMUNITY)
Admission: RE | Admit: 2021-11-17 | Discharge: 2021-11-17 | Disposition: A | Discharge status: Home / Self Care | Payer: Managed Care, Other (non HMO) | Source: Ambulatory Visit

## 2021-11-17 ENCOUNTER — Ambulatory Visit (INDEPENDENT_AMBULATORY_CARE_PROVIDER_SITE_OTHER): Payer: Managed Care, Other (non HMO)

## 2021-11-17 VITALS — BP 120/76 | HR 99 | Temp 98.8°F | Ht 65.0 in | Wt 171.8 lb

## 2021-11-17 DIAGNOSIS — Z1239 Encounter for other screening for malignant neoplasm of breast: Secondary | ICD-10-CM

## 2021-11-17 DIAGNOSIS — Z113 Encounter for screening for infections with a predominantly sexual mode of transmission: Secondary | ICD-10-CM | POA: Diagnosis not present

## 2021-11-17 DIAGNOSIS — Z01419 Encounter for gynecological examination (general) (routine) without abnormal findings: Secondary | ICD-10-CM

## 2021-11-17 DIAGNOSIS — Z3042 Encounter for surveillance of injectable contraceptive: Secondary | ICD-10-CM

## 2021-11-17 NOTE — Progress Notes (Signed)
Patient presents for annual exam and depo provera today. Last pap smear was 06/2020. Patient desires STD testing today.   Vonzella Nipple, PA-C 11/17/2021 9:17 AM

## 2021-11-17 NOTE — Patient Instructions (Signed)
AREA FAMILY PRACTICE PHYSICIANS  [place] ([address]) [facility] [address] [place], [place] [address] [phone] Mon-Fri 8:30-12:30, 1:30-5:00 Accepting Medicaid [facility] [address], [place], [place] [address] [phone] Mon-Fri 8:00-5:30 [facility] [address] [place], [place] [address] [phone] Mon, Tue, Thur, Fri 8:30-5:00, Wed 10:00-7:00 (closed 1-2pm) Accepting Medicaid [facility] [address], [place], [place]  [address] Phone - [phone]   Fax - [phone]  [place] ([address]) [facility] [address] [place], [place] [address] [phone] Mon-Fri 8:00-5:00 [facility] ([facility])  [address] [place], [place] [address] [phone] Mon-Fri 8:30-5:30, Sat (Oct.-Mar.) 9:00-1:00 Accepting Medicaid  [place] ([address]) [facility] [address], [place], [place] [address] [phone] Mon-Fri 8:00-5:00  [place] ([address]) [facility] [address], [place], [place] [address] [phone] Mon-Fri 8:00-5:00 [facility] [address], [place], [place] [address] [phone] Mon-Fri 8:00-5:00 [facility] [address] [place], [place] [address] [phone] Mon-Fri 8:00-5:00 [facility] [address] [place] [address] [phone] Mon-Fri 7:30-5:30  [place] ([address] & [address]) [facility] [address] [place], [place] [address] [phone] Mon-Thur 8:00-6:00 Accepting Medicaid [facility] [address] [place], [place] [address] [phone] Mon-Thur 7:30-7:30, Fri 7:30-4:30 Accepting  Medicaid [facility] [address], [place], [place]  [address] [phone]   Fax - [phone]  [place] ([address] & [address]) [facility] [address] [place], [place] [address] [phone] Mon-Fri 7:00-5:00 [facility] [address], [place], [place] [address] [phone] Mon-Fri 8:00-5:00 Accepting Medicaid [facility] [address], [place], [place] [address] [phone] Mon-Fri 8:00-5:00 Accepting Medicaid  [place] ([address]) [facility] [place] [address] [place], [place] [address] [phone] Mon-Fri 8:00-5:00 [facility] [address], [place], [place] [address] [phone] Mon-Fri 8:00-5:00 Accepting Medicaid [facility] ([facility] [address], [place], [place] [address] [phone] Mon-Fri 8:00-5:30, Sat&Sun by appointment (phones open at 8:30) Accepting Medicaid  [place] ([address]) [facility] [address] [place], [place] [address] [phone] Mon-Thur 8:00-7:00, Fri 8:00-5:00, Sat 8:00-12:00, Sun 9:00-12:00 Accepting Medicaid [facility] [address], [place], [place] [address] [phone] Mon-Thur 8:00-5:00 Accepting Medicaid [facility] [address] [place], [place] [address] [phone] Mon-Fri 8:00-5:30, Sat (Oct.-Mar.) 9:00-1:00 Accepting Medicaid  [place] ([address]) [facility] [address], [place], [place] [address] [phone] Mon-Fri 8:00-5:00 Accepting Medicaid   [place] ([address]) [facility] [place] [address], [place], [place] [address] [phone] Mon-Fri 8:00-5:00 [facility] [place] [address], [place], [place] [address] [phone] Mon-Fri 8:00-5:00 [facility] [place] [address] [place] [address], [place],  [address] [phone] Mon-Fri 8:00-5:00 After hours clinic ([address] [place],  [address]) [phone] Mon-Fri 5:00-8:00, Sat 12:00-6:00, Sun 10:00-4:00 Accepting Medicaid [facility] [place]   [address], [place], [place]  [address] [phone]   Fax - [phone]  [place] ([address]) [facility] [place] [facility] [address], [place], [place] [address] [phone] Mon-Fri 8:00-5:00 [facility] [place] ([facility] [place]) [address], [place], [place] [address] [phone] Mon-Thur 8:00-7:00, Fri 8:00-5:00, Sat 8:00-12:00    

## 2021-11-17 NOTE — Progress Notes (Signed)
GYNECOLOGY OFFICE VISIT NOTE-WELL WOMAN EXAM  History:   Shirley Navarro [id number] here today for annual exam. She denies any abnormal vaginal discharge, but reports some pelvic pain "here and there."  She states it is on her right side, sharp, and goes away.  She does not note any timing pattern. She states she has some bleeding prior to her injections that she reports is "slow then picks up then slows back down."  She reports it lasts about 2 weeks.   Birth Control:  Depo Provera "Okay."  States eating habits have changed and notices spotting prior to injection.  Reproductive Concerns Sexually Active: Yes Partners Type: Female Number of partners in last year: One STD Testing: Full  Vaginal/GU Concerns: Denies vaginal issues.  No issues with constipation or urination, but reports some diarrhea.  She states it is loose watery stool that has been daily for the past week. Breast Concerns/Exams: She denies concerns, but reports she had a lump that has since disappeared.  She reports she has an [place] scheduled. She questions if she should keep her [place].  She endorses checking her breast occasionally.  She endorses family history of breast with paternal grandmother, paternal great aunt, and paternal cousin.  She denies known family history uterine, cervical, or ovarian cancer.  Medical and Nutrition PCP: None; Will give list Significant PMx: She endorses history of asthma and uses inhaler with last usage "a minute" ago.  She goes on to report usage about 3-4 weeks ago with illness.  Exercise: "Not like I want to."  She reports none since delivery of her daughter in December 2021. Tobacco/Drugs/Alcohol: Social usage of alcohol.  Nutrition: Denies balanced intake.  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   Past Medical History:  Diagnosis Date    Asthma     Past Surgical History:  Procedure Laterality Date   NO PAST SURGERIES      The following portions of the patient's history were reviewed and updated as appropriate: allergies, current medications, past family history, past medical history, past social history, past surgical history and problem list.   Health Maintenance:  Normal pap and negative HRHPV on NILM 06/2019.  No mammogram d/t age .   Review of Systems:  Pertinent items noted in HPI and remainder of comprehensive ROS otherwise negative.    Objective:    Physical Exam BP 120/76    Pulse 99    Temp 98.8 F (37.1 C)    Ht 5\' 5"  (1.651 m)    Wt 171 lb 12.8 oz (77.9 kg)    BMI 28.59 kg/m  Physical Exam Vitals reviewed. Exam conducted with a chaperone present.  Constitutional:      Appearance: Normal appearance.  HENT:     Head: Normocephalic and atraumatic.  Eyes:     Conjunctiva/sclera: Conjunctivae normal.  Neck:     Thyroid: No thyroid mass or thyroid tenderness.     Trachea: Trachea normal.  Cardiovascular:     Rate and Rhythm: Normal rate and regular rhythm.     Heart sounds: Normal heart sounds.  Pulmonary:     Effort: Pulmonary effort is normal. No respiratory distress.     Breath sounds: Normal breath sounds.  Chest:  Breasts:    Right: Normal. No swelling, mass, nipple discharge, skin change or tenderness.     Left: Normal. No swelling, mass, nipple  discharge, skin change or tenderness.     Comments: CBE complete Abdominal:     General: Bowel sounds are normal.  Genitourinary:    Comments: CV collected NEFG. Minimal amt blood noted near cervix.  Musculoskeletal:        General: Normal range of motion.     Cervical back: Normal range of motion. No rigidity.  Skin:    General: Skin is warm and dry.  Neurological:     Mental Status: She is alert and oriented to person, place, and time.  Psychiatric:        Mood and Affect: Mood normal.        Behavior: Behavior normal.        Thought  Content: Thought content normal.     Labs and Imaging No results found for this or any previous visit (from the past 168 hour(s)). No results found.   Assessment & Plan:  26 year old Annual Exam Desires STD Testing Breast Exam  1. Encounter for well woman exam -Exam performed and findings discussed. -Educated on ASCCP guidelines regarding pap smear evaluation and frequency. -Informed next pap smear due next year.  -Encouraged to activate and utilize Mychart for reviewing of results, communication with office, and scheduling of appts. -Educated on AHA exercise recommendations of 30 minutes of moderate to vigorous activity at least 5x/week.    2. Screening examination for STD (sexually transmitted disease) -Patient requests full STD screen. -CV collected. -Patient to have labs, but left prior to completion. -Provider calls patient and she states she will return at a later date to perform.   3. Surveillance for Depo-Provera contraception -Depo provera to be given today. -Discussed potentially addiing on 2nd, short term agent to help with bleeding. -Patient instructed to monitor and report any worsening of symptoms. -Already receiving depo provera at early possible interval.  4. Screening breast examination -CBE completed and normal. -Instructed to keep [place] appt as scheduled. -Informed that [place] may be able to identify abnormalities that can not be palpated.  -Educated and encouraged to perform monthly SBE with increased breast awareness including examination of breast for skin changes, moles, tenderness, etc.  Routine preventative health maintenance measures emphasized. Please refer to After Visit Summary for other counseling recommendations.   No follow-ups on file.      Cherre Robins, CNM 11/17/2021

## 2021-11-18 LAB — CERVICOVAGINAL ANCILLARY ONLY
Chlamydia: NEGATIVE
Comment: NEGATIVE
Comment: NEGATIVE
Comment: NORMAL
Neisseria Gonorrhea: NEGATIVE
Trichomonas: NEGATIVE

## 2021-12-07 ENCOUNTER — Ambulatory Visit
Admission: RE | Admit: 2021-12-07 | Discharge: 2021-12-07 | Disposition: A | Discharge status: Home / Self Care | Payer: Managed Care, Other (non HMO) | Source: Ambulatory Visit

## 2021-12-07 ENCOUNTER — Other Ambulatory Visit: Payer: Self-pay

## 2021-12-07 DIAGNOSIS — N63 Unspecified lump in unspecified breast: Secondary | ICD-10-CM

## 2022-02-04 ENCOUNTER — Ambulatory Visit (INDEPENDENT_AMBULATORY_CARE_PROVIDER_SITE_OTHER): Payer: Managed Care, Other (non HMO) | Admitting: *Deleted

## 2022-02-04 VITALS — BP 132/78 | HR 86 | Ht 65.0 in | Wt 170.7 lb

## 2022-02-04 DIAGNOSIS — Z3042 Encounter for surveillance of injectable contraceptive: Secondary | ICD-10-CM | POA: Diagnosis not present

## 2022-02-04 MED ORDER — MEDROXYPROGESTERONE ACETATE 150 MG/ML IM SUSP
150.0000 mg | Freq: Once | INTRAMUSCULAR | Status: AC
  Administered 2022-02-04: 150 mg via INTRAMUSCULAR

## 2022-02-04 NOTE — Progress Notes (Signed)
Here for depo-provera a few days early.States she called and told them she will be on vacation next week and did not want to be late and was told can get a few days early. Patient brought medication from her pharmacy. Injection given without complaint. Will check out and make appointment for next injection 04/22/22-05/06/22. Next annual due 11/18/22. Pap due 06/2023.  ?Nancy Fetter ?

## 2022-04-25 ENCOUNTER — Other Ambulatory Visit: Payer: Self-pay

## 2022-04-25 ENCOUNTER — Ambulatory Visit (INDEPENDENT_AMBULATORY_CARE_PROVIDER_SITE_OTHER): Payer: Medicaid Other | Admitting: *Deleted

## 2022-04-25 VITALS — BP 127/78 | HR 67 | Ht 64.75 in | Wt 165.3 lb

## 2022-04-25 DIAGNOSIS — Z3042 Encounter for surveillance of injectable contraceptive: Secondary | ICD-10-CM

## 2022-04-25 MED ORDER — MEDROXYPROGESTERONE ACETATE 150 MG/ML IM SUSP
150.0000 mg | Freq: Once | INTRAMUSCULAR | Status: AC
  Administered 2022-04-25: 150 mg via INTRAMUSCULAR

## 2022-04-25 NOTE — Progress Notes (Signed)
Here for depo-provera . Last depo-provera was 02/04/22. Last annual exam was 11/17/21. Last pap was 07/01/19. Advised needs pap 06/2022 and maybe able to do with next depo-provera which is due 06/11/22-06/25/22. Injection given without complaint. Sent to registrar to check out and schedule next visit.  Nancy Fetter

## 2022-06-16 ENCOUNTER — Ambulatory Visit (INDEPENDENT_AMBULATORY_CARE_PROVIDER_SITE_OTHER): Payer: Medicaid Other | Admitting: General Practice

## 2022-06-16 ENCOUNTER — Other Ambulatory Visit: Payer: Self-pay

## 2022-06-16 VITALS — BP 140/86 | HR 67 | Ht 64.0 in | Wt 160.0 lb

## 2022-06-16 DIAGNOSIS — Z3042 Encounter for surveillance of injectable contraceptive: Secondary | ICD-10-CM | POA: Diagnosis not present

## 2022-06-16 NOTE — Progress Notes (Signed)
Samuel Germany here for Depo-Provera Injection. Injection administered without complication. Patient will return in 3 months for next injection between Nov 23 and Dec 7. Next annual visit due February 2024.   Marylynn Pearson, RN 06/16/2022  3:22 PM

## 2022-07-08 ENCOUNTER — Ambulatory Visit: Payer: Medicaid Other | Admitting: Advanced Practice Midwife

## 2022-07-21 ENCOUNTER — Ambulatory Visit: Payer: Medicaid Other | Admitting: Family Medicine

## 2022-07-25 ENCOUNTER — Ambulatory Visit: Payer: Medicaid Other | Admitting: Obstetrics and Gynecology

## 2022-08-03 ENCOUNTER — Encounter: Payer: Self-pay | Admitting: Advanced Practice Midwife

## 2022-08-15 ENCOUNTER — Encounter: Payer: Self-pay | Admitting: Advanced Practice Midwife

## 2022-08-15 ENCOUNTER — Ambulatory Visit (INDEPENDENT_AMBULATORY_CARE_PROVIDER_SITE_OTHER): Payer: Medicaid Other | Admitting: Advanced Practice Midwife

## 2022-08-15 ENCOUNTER — Other Ambulatory Visit (HOSPITAL_COMMUNITY)
Admission: RE | Admit: 2022-08-15 | Discharge: 2022-08-15 | Disposition: A | Discharge status: Home / Self Care | Payer: Medicaid Other | Source: Ambulatory Visit | Attending: Advanced Practice Midwife | Admitting: Advanced Practice Midwife

## 2022-08-15 VITALS — BP 140/83 | HR 93 | Wt 162.2 lb

## 2022-08-15 DIAGNOSIS — N921 Excessive and frequent menstruation with irregular cycle: Secondary | ICD-10-CM | POA: Diagnosis not present

## 2022-08-15 DIAGNOSIS — Z01419 Encounter for gynecological examination (general) (routine) without abnormal findings: Secondary | ICD-10-CM | POA: Insufficient documentation

## 2022-08-15 DIAGNOSIS — N946 Dysmenorrhea, unspecified: Secondary | ICD-10-CM | POA: Diagnosis not present

## 2022-08-15 DIAGNOSIS — Z0000 Encounter for general adult medical examination without abnormal findings: Secondary | ICD-10-CM | POA: Diagnosis not present

## 2022-08-15 MED ORDER — NORGESTIMATE-ETH ESTRADIOL 0.25-35 MG-MCG PO TABS: 1.0000 | ORAL_TABLET | Freq: Every day | ORAL | 2 refills | Status: DC

## 2022-08-15 MED ORDER — IBUPROFEN 800 MG PO TABS: 800.0000 mg | ORAL_TABLET | Freq: Three times a day (TID) | ORAL | 1 refills | Status: DC | PRN

## 2022-08-15 NOTE — Progress Notes (Signed)
Subjective:     Shirley Navarro is a 26 y.o. female here at [facility] for a routine exam.  Current complaints: Pt likes Depo, and had been using Depo for contraception for 2 years but recently in the last 2-3 months has heavier irregular bleeding and cramping associated with the bleeding. There is nausea/vomiting also with the bleeding.   Personal health questionnaire reviewed: yes.  Do you have a primary care provider? yes Do you feel safe at home? yes  Flowsheet Row Routine Prenatal from 06/24/2020 in [facility]  PHQ-2 Total Score 0       Health Maintenance Due  Topic Date Due   COVID-19 Vaccine (1) Never done   HPV VACCINES (1 - 2-dose series) Never done   INFLUENZA VACCINE  Never done     Risk factors for chronic health problems: Smoking: No Alchohol/how much: None Pt BMI: Body mass index is 27.84 kg/m.   Gynecologic History No LMP recorded. Patient has had an injection. Contraception: Depo-Provera injections Last Pap: 06/2019. Results were: normal Last mammogram: n/a.   Obstetric History OB History  Gravida Para Term Preterm AB Living  1 1 1     1   SAB IAB Ectopic Multiple Live Births        0 1    # Outcome Date GA Lbr Len/2nd Weight Sex Delivery Anes PTL Lv  1 Term 09/19/20 [redacted]w[redacted]d 07:30 / 02:13 8 lb 12.2 oz (3.975 kg) F Vag-Spont EPI  LIV     Birth Comments: WDL     The following portions of the patient's history were reviewed and updated as appropriate: allergies, current medications, past family history, past medical history, past social history, past surgical history, and problem list.  Review of Systems Pertinent items noted in HPI and remainder of comprehensive ROS otherwise negative.    Objective:   BP (!) 140/83   Pulse 93   Wt 162 lb 3.2 oz (73.6 kg)   Breastfeeding No   BMI 27.84 kg/m  VS reviewed, nursing note reviewed,  Constitutional: well developed, well nourished, no distress HEENT: normocephalic CV: normal  rate Pulm/chest wall: normal effort Breast Exam: Deferred with low risks and shared decision making, discussed recommendation to start mammogram between 40-50 yo/  Abdomen: soft Neuro: alert and oriented x 3 Skin: warm, dry Psych: affect normal Pelvic exam: Performed: Cervix pink, visually closed, without lesion, scant white creamy discharge, vaginal walls and external genitalia normal Bimanual exam: Cervix 0/long/high, firm, anterior, neg CMT, uterus nontender, nonenlarged, adnexa without tenderness, enlargement, or mass       Assessment/Plan:   1. Breakthrough bleeding on depo provera --Discussed pt contraceptive plans and reviewed contraceptive methods based on pt preferences and effectiveness.  Pt prefers to continue Depo. --Will try short course of OCPs x 28 days --F/U in office in 3 months, if pain and bleeding persists, consider [redacted]w[redacted]d and further work up  - norgestimate-ethinyl estradiol (ORTHO-CYCLEN) 0.25-35 MG-MCG tablet; Take 1 tablet by mouth daily.  Dispense: 28 tablet; Refill: 2  2. Well woman exam with routine gynecological exam  - Cytology - PAP( [place]) - Cervicovaginal ancillary only( [place]) - Hepatitis B Surface AntiGEN - Hepatitis C Antibody - HIV antibody (with reflex)  3. Dysmenorrhea  - ibuprofen (ADVIL) 800 MG tablet; Take 1 tablet (800 mg total) by mouth every 8 (eight) hours as needed.  Dispense: 60 tablet; Refill: 1     Return in about 3 months (around 11/15/2022)  for Gyn follow up for Abnormal Uterine Bleeding, Any provider.   Fatima Blank, CNM 5:00 PM

## 2022-08-16 LAB — CERVICOVAGINAL ANCILLARY ONLY
Bacterial Vaginitis (gardnerella): POSITIVE — AB
Chlamydia: NEGATIVE
Comment: NEGATIVE
Comment: NEGATIVE
Comment: NEGATIVE
Comment: NORMAL
Neisseria Gonorrhea: NEGATIVE
Trichomonas: NEGATIVE

## 2022-08-16 LAB — HIV ANTIBODY (ROUTINE TESTING W REFLEX): HIV Screen 4th Generation wRfx: NONREACTIVE

## 2022-08-16 LAB — HEPATITIS C ANTIBODY: Hep C Virus Ab: NONREACTIVE

## 2022-08-16 LAB — CYTOLOGY - PAP
Chlamydia: NEGATIVE
Comment: NEGATIVE
Comment: NORMAL
Diagnosis: NEGATIVE
Neisseria Gonorrhea: NEGATIVE

## 2022-08-16 LAB — HEPATITIS B SURFACE ANTIGEN: Hepatitis B Surface Ag: NEGATIVE

## 2022-08-18 ENCOUNTER — Encounter: Payer: Self-pay | Admitting: Advanced Practice Midwife

## 2022-08-18 ENCOUNTER — Other Ambulatory Visit: Payer: Self-pay | Admitting: Lactation Services

## 2022-08-18 DIAGNOSIS — B9689 Other specified bacterial agents as the cause of diseases classified elsewhere: Secondary | ICD-10-CM

## 2022-08-18 MED ORDER — METRONIDAZOLE 500 MG PO TABS: 500.0000 mg | ORAL_TABLET | Freq: Two times a day (BID) | ORAL | 0 refills | Status: DC

## 2022-08-19 MED ORDER — METRONIDAZOLE 500 MG PO TABS: 500.0000 mg | ORAL_TABLET | Freq: Two times a day (BID) | ORAL | 0 refills | Status: DC

## 2022-08-19 NOTE — Addendum Note (Signed)
Addended by: Maxwell Marion E on: 08/19/2022 12:44 PM   Modules accepted: Orders

## 2022-08-24 ENCOUNTER — Encounter: Payer: Self-pay | Admitting: Advanced Practice Midwife

## 2022-08-26 ENCOUNTER — Telehealth: Payer: Self-pay

## 2022-08-26 NOTE — Telephone Encounter (Signed)
Called pt in regards to her complaints of nausea and cramping. Pt states she is currently taking new regiment of OPCs and 800mg  Ibuprofen q8hrs as prescribed by L. Leftwich-Kirby CNM on 11/06. RN suggested alternating ibuprofen and Tylenol for pain relief as well as using hot packs. Advised pt to try new plan of care through the weekend and to reach out on Monday if she is continuing to experience symptoms. Pt agreeable with plan of care and denied further questions.

## 2022-08-29 ENCOUNTER — Ambulatory Visit: Payer: Medicaid Other | Admitting: Obstetrics and Gynecology

## 2022-08-29 ENCOUNTER — Encounter: Payer: Self-pay | Admitting: Obstetrics and Gynecology

## 2022-08-29 ENCOUNTER — Encounter: Payer: Self-pay | Admitting: Family Medicine

## 2022-08-29 VITALS — BP 132/89 | HR 76 | Wt 159.8 lb

## 2022-08-29 DIAGNOSIS — N946 Dysmenorrhea, unspecified: Secondary | ICD-10-CM

## 2022-08-29 DIAGNOSIS — M791 Myalgia, unspecified site: Secondary | ICD-10-CM | POA: Diagnosis not present

## 2022-08-29 DIAGNOSIS — N930 Postcoital and contact bleeding: Secondary | ICD-10-CM

## 2022-08-29 MED ORDER — DOXYCYCLINE HYCLATE 100 MG PO CAPS: 100.0000 mg | ORAL_CAPSULE | Freq: Two times a day (BID) | ORAL | 0 refills | Status: AC

## 2022-08-29 MED ORDER — CYCLOBENZAPRINE HCL 10 MG PO TABS: 5.0000 mg | ORAL_TABLET | Freq: Every evening | ORAL | 2 refills | Status: DC | PRN

## 2022-08-29 NOTE — Progress Notes (Unsigned)
GYNECOLOGY VISIT  Patient name: Shirley Navarro MRN [id number]  Date of birth: 08/25/1996 Chief Complaint:   contraception questions  History:  Samuel Germany is a 26 y.o. G1P1001 being seen today for contraception management. Notes that 2 prior cycles she has been having issues, had been on depo since delivery . Bleeding has been resolved with the OCP course but the cramping has been significant. Typically will have some bleeding just before her depo injection is due but these last 2 have been way more severe than she is used tl. Would have terrible cramps in high school, when on birth control more regularly, it got better. Daily issues of pain for about 2 weeks now. Pain with bleeding and then had her depo injection, pain at least daily but not severe every day. For the vagina, feels like soemthing is pulling down and in the abdomen is cramping that goes into the back - like something is pulling on muscles. No pain with voiding or BM. No pain with intercourse but having bleeding afterwards for the last 3 months. No abnormal discharge, chills without fever. She was diagnosed with BV last visit and completed 5 of 7 days (stopped due to significant nausea)   Past Medical History:  Diagnosis Date   Asthma     Past Surgical History:  Procedure Laterality Date   NO PAST SURGERIES      The following portions of the patient's history were reviewed and updated as appropriate: allergies, current medications, past family history, past medical history, past social history, past surgical history and problem list.   Health Maintenance:   Last pap 08/2022. Results were: NILM w/ HRHPV not done. H/O abnormal pap: no Last mammogram: N/A   Review of Systems:  Pertinent items are noted in HPI. Comprehensive review of systems was otherwise negative.   Objective:  Physical Exam BP 132/89   Pulse 76   Wt 159 lb 12.8 oz (72.5 kg)   Breastfeeding No   BMI 27.43 kg/m    Physical  Exam Vitals and nursing note reviewed. Exam conducted with a chaperone present.  Constitutional:      Appearance: Normal appearance.  HENT:     Head: Normocephalic and atraumatic.  Cardiovascular:     Rate and Rhythm: Normal rate and regular rhythm.  Pulmonary:     Effort: Pulmonary effort is normal.     Breath sounds: Normal breath sounds.  Abdominal:     Comments: Soft, lower abdominal tenderness + carnett on left, equivocal on right  - straight leg bilateral   Genitourinary:    General: Normal vulva.     Exam position: Lithotomy position.     Vagina: Normal.     Cervix: No cervical motion tenderness.     Uterus: Normal.      Comments: Normal vulvar sensation Mild pelvic myalgia bilaterally No CMT Skin:    General: Skin is warm and dry.  Neurological:     General: No focal deficit present.     Mental Status: She is alert.  Psychiatric:        Mood and Affect: Mood normal.        Behavior: Behavior normal.        Thought Content: Thought content normal.        Judgment: Judgment normal.    Labs and Imaging No results found.     Assessment & Plan:  1. Dysmenorrhea Pelvic ultrasound to assess for possible structural etiology of pain and  postcoital bleeding. Possible endometritis causing pain, will presumptively treat with doxy x 2 weeks. Reviewed precautions with taking doxy. .  - US PELVIC COMPLETE WITH TRANSVAGINAL; Future - doxycycline (VIBRAMYCIN) 100 MG capsule; Take 1 capsule (100 mg total) by mouth 2 (two) times daily for 14 days.  Dispense: 28 capsule; Refill: 0 - cyclobenzaprine (FLEXERIL) 10 MG tablet; Take 0.5 tablets (5 mg total) by mouth at bedtime as needed for muscle spasms.  Dispense: 15 tablet; Refill: 2  2. Myalgia Mild abdominopelvic myalgia, referral to PT - may be due to longstanding dysmenorrhea. Trial of flexeril - has small hcilcren and working and worried about sedating effects of meds as well. - Ambulatory referral to Physical  Therapy   Routine preventative health maintenance measures emphasized.  Lorriane Shire, MD [facility], [facility]

## 2022-08-29 NOTE — Patient Instructions (Signed)
Flexiril for pain Doxycycline for possible endometritis Pelvic physical therapy  Pelvic ultrasound

## 2022-09-09 ENCOUNTER — Ambulatory Visit: Payer: Medicaid Other

## 2022-09-12 ENCOUNTER — Ambulatory Visit (INDEPENDENT_AMBULATORY_CARE_PROVIDER_SITE_OTHER): Payer: Medicaid Other

## 2022-09-12 DIAGNOSIS — Z3042 Encounter for surveillance of injectable contraceptive: Secondary | ICD-10-CM

## 2022-09-12 MED ORDER — MEDROXYPROGESTERONE ACETATE 150 MG/ML IM SUSP
150.0000 mg | Freq: Once | INTRAMUSCULAR | Status: AC
  Administered 2022-09-12: 150 mg via INTRAMUSCULAR

## 2022-09-12 NOTE — Progress Notes (Signed)
Shirley Navarro here for Depo-Provera Injection. Injection administered without complication. Patient will return in 3 months for next injection between 02/19 and 03/05. Next annual visit due February 2024.   Janeece Agee, RN 09/12/2022  9:59 AM

## 2022-09-23 ENCOUNTER — Ambulatory Visit: Payer: Medicaid Other | Attending: Obstetrics and Gynecology | Admitting: Physical Therapy

## 2022-09-26 ENCOUNTER — Ambulatory Visit: Payer: Medicaid Other | Admitting: Obstetrics and Gynecology

## 2022-11-30 ENCOUNTER — Ambulatory Visit (INDEPENDENT_AMBULATORY_CARE_PROVIDER_SITE_OTHER): Payer: Medicaid Other | Admitting: Obstetrics and Gynecology

## 2022-11-30 ENCOUNTER — Encounter: Payer: Self-pay | Admitting: Obstetrics and Gynecology

## 2022-11-30 VITALS — BP 116/77 | HR 99 | Ht 65.0 in | Wt 157.8 lb

## 2022-11-30 DIAGNOSIS — Z0000 Encounter for general adult medical examination without abnormal findings: Secondary | ICD-10-CM | POA: Diagnosis not present

## 2022-11-30 DIAGNOSIS — Z309 Encounter for contraceptive management, unspecified: Secondary | ICD-10-CM | POA: Diagnosis not present

## 2022-11-30 MED ORDER — CALCIUM 600 MG PO TABS: 600.0000 mg | ORAL_TABLET | Freq: Two times a day (BID) | ORAL | Status: DC

## 2022-11-30 MED ORDER — VITAMIN D 125 MCG (5000 UT) PO CAPS: 1.0000 | ORAL_CAPSULE | Freq: Every day | ORAL | Status: DC

## 2022-11-30 NOTE — Progress Notes (Signed)
Shirley Navarro here for Depo-Provera  Injection.  Injection administered without complication. Patient will return in 3 months  between 02/16/23-03/02/23 for next injection.  Bethanne Ginger, CMA 11/30/2022  8:44 AM

## 2022-11-30 NOTE — Progress Notes (Signed)
Obstetrics and Gynecology Annual Patient Evaluation  Appointment Date: 11/30/2022  OBGYN Clinic: [facility]  Primary Care Provider: Pcp, No   Chief Complaint:  Chief Complaint  Patient presents with   Gynecologic Exam  Depo Provera surveillance  History of Present Illness: Shirley Navarro is a 27 y.o. G1P1001 (No LMP recorded. Patient has had an injection.), seen for the above chief complaint. Her past medical history is significant for nothing  Patient has been on Depo provera since March 2022. She saw Dr. Currie Paris in November for the BTB with Depo and was put on Sprintec and an u/s ordered.  Patient has continued on the [place] and states she has no BTB but does have painful periods on the placebo week when she has periods; she never got the u/s.    ?malodorous urine and suprapubic discomfort.   Review of Systems: Pertinent items noted in HPI and remainder of comprehensive ROS otherwise negative.   Past Medical History:  Past Medical History:  Diagnosis Date   Asthma     Past Surgical History:  Past Surgical History:  Procedure Laterality Date   NO PAST SURGERIES      Past Obstetrical History:  OB History  Gravida Para Term Preterm AB Living  1 1 1     1  $ SAB IAB Ectopic Multiple Live Births        0 1    # Outcome Date GA Lbr Len/2nd Weight Sex Delivery Anes PTL Lv  1 Term 09/19/20 [id number]:30 / 02:13 8 lb 12.2 oz (3.975 kg) F Vag-Spont EPI  LIV     Birth Comments: WDL   Past Gynecological History: As per HPI. History of Pap Smear(s): Yes.   Last pap 08/2022, which was wnl STI screening: negative 08/2022  Social History:  Social History   Socioeconomic History   Marital status: Married    Spouse name: [name]   Number of children: Not on file   Years of education: Not on file   Highest education level: Not on file  Occupational History   Occupation: Pharmacy Tech  Tobacco Use   Smoking status: Never    Smokeless tobacco: Never  Vaping Use   Vaping Use: Never used  Substance and Sexual Activity   Alcohol use: Not Currently    Comment: Socially   Drug use: Not Currently    Types: Marijuana    Comment: Last smoked  April 2021   Sexual activity: Not Currently  Other Topics Concern   Not on file  Social History Narrative   ** Merged History Encounter **       Social Determinants of Health   Financial Resource Strain: Not on file  Food Insecurity: No Food Insecurity (06/01/2020)   Hunger Vital Sign    Worried About Running Out of Food in the Last Year: Never true    Ran Out of Food in the Last Year: Never true  Transportation Needs: No Transportation Needs (06/01/2020)   PRAPARE - THydrologist(Medical): No    Lack of Transportation (Non-Medical): No  Physical Activity: Inactive (06/01/2020)   Exercise Vital Sign    Days of Exercise per Week: 0 days    Minutes of Exercise per Session: 0 min  Stress: Not on file  Social Connections: Not on file  Intimate Partner Violence: Not At Risk (06/01/2020)   Humiliation, Afraid, Rape, and Kick questionnaire    Fear of Current or Ex-Partner: No  Emotionally Abused: No    Physically Abused: No    Sexually Abused: No    Family History:  Family History  Problem Relation Age of Onset   Healthy Mother    Healthy Father    Breast cancer Paternal Aunt    Diabetes Maternal Grandmother    Breast cancer Paternal Grandmother     Medications Ricki Miller "Zy'Keria" had no medications administered during this visit. Current Outpatient Medications  Medication Sig Dispense Refill   albuterol (VENTOLIN HFA) 108 (90 Base) MCG/ACT inhaler Inhale 1-2 puffs into the lungs every 6 (six) hours as needed for wheezing or shortness of breath. 1 each 0   calcium carbonate (OS-CAL) 600 MG tablet Take 1 tablet (600 mg total) by mouth 2 (two) times daily with a meal. 30 tablet    Cholecalciferol (VITAMIN D) 125 MCG (5000  UT) CAPS Take 1 capsule by mouth daily. 30 capsule    cyclobenzaprine (FLEXERIL) 10 MG tablet Take 0.5 tablets (5 mg total) by mouth at bedtime as needed for muscle spasms. 15 tablet 2   escitalopram (LEXAPRO) 10 MG tablet Take 10 mg by mouth daily.     fluticasone (FLONASE) 50 MCG/ACT nasal spray Place 1 spray into both nostrils daily.     fluticasone-salmeterol (ADVAIR) 100-50 MCG/ACT AEPB Inhale 1 puff into the lungs 2 (two) times daily.     ibuprofen (ADVIL) 800 MG tablet Take 1 tablet (800 mg total) by mouth every 8 (eight) hours as needed. 60 tablet 1   medroxyPROGESTERone (DEPO-PROVERA) 150 MG/ML injection Inject 1 mL (150 mg total) into the muscle every 3 (three) months. 1 mL 3   norgestimate-ethinyl estradiol (ORTHO-CYCLEN) 0.25-35 MG-MCG tablet Take 1 tablet by mouth daily. 28 tablet 2   escitalopram (LEXAPRO) 5 MG tablet Take 5 mg by mouth daily. (Patient not taking: Reported on 09/12/2022)     metroNIDAZOLE (FLAGYL) 500 MG tablet Take 1 tablet (500 mg total) by mouth 2 (two) times daily. 14 tablet 0   Current Facility-Administered Medications  Medication Dose Route Frequency Provider Last Rate Last Admin   medroxyPROGESTERone (DEPO-PROVERA) injection 150 mg  150 mg Intramuscular Q90 days Laury Deep, CNM   150 mg at 06/16/22 1530    Allergies Codeine   Physical Exam:  BP 116/77   Pulse 99   Ht 5' 5"$  (1.651 m)   Wt 157 lb 12.8 oz (71.6 kg)   BMI 26.26 kg/m  Body mass index is 26.26 kg/m. General appearance: Well nourished, well developed female in no acute distress.  Neck:  Supple, normal appearance, and no thyromegaly  Respiratory:  Normal respiratory effort Abdomen: soft, nttp, nd Neuro/Psych:  Normal mood and affect.   Laboratory: as per hpi  Radiology: as per hpi  Assessment: patient stable  Plan:  1. Well woman exam (no gynecological exam)  2. Encounter for contraceptive management, unspecified type Patient counseled about potential for decrease in bone  density with depo prover and I recommended she start supplemental vitamin d and calcium. I also recommended she finish out the [place] and see what her bleeding is like on just Depo Provera; patient received depo provera today.  Will check Ucx and pt told if abdominal discomfort to call and schedule an u/s  RTC 57mfor consideration for another depo provera shot.   CDurene RomansMD Attending Center for [employer](Fish farm manager

## 2023-02-14 ENCOUNTER — Other Ambulatory Visit: Payer: Self-pay

## 2023-02-14 ENCOUNTER — Ambulatory Visit (INDEPENDENT_AMBULATORY_CARE_PROVIDER_SITE_OTHER): Payer: Medicaid Other

## 2023-02-14 VITALS — BP 121/80 | HR 64 | Wt 157.3 lb

## 2023-02-14 DIAGNOSIS — Z3042 Encounter for surveillance of injectable contraceptive: Secondary | ICD-10-CM | POA: Diagnosis not present

## 2023-02-14 MED ORDER — MEDROXYPROGESTERONE ACETATE 150 MG/ML IM SUSY
150.0000 mg | PREFILLED_SYRINGE | Freq: Once | INTRAMUSCULAR | Status: AC
Start: 2023-02-14 — End: 2023-02-14
  Administered 2023-02-14: 150 mg via INTRAMUSCULAR

## 2023-02-14 NOTE — Progress Notes (Signed)
Shirley Navarro here for Depo-Provera Injection. Injection administered without complication. Patient will return in 3 months for next injection between 7/23 and 8/6. Next annual visit due February 2024.   Reports recent PCP visit. States she had mild pain with exam of right lower abdomen. PCP recommended she follow up with GYN for [place]. Has talked with GYN provider about this before and was told it was a possible ovarian cyst, but the pain has never been present during exam with GYN. Pt reports this pain is intermittent and does not interfere with daily activities. Will last for about 1 week when present. Offered visit with provider to discuss further. Pt declines, but will contact office if this becomes concerning.   Marjo Bicker, RN 02/14/2023  8:37 AM

## 2023-05-02 ENCOUNTER — Ambulatory Visit: Payer: Medicaid Other

## 2023-05-22 ENCOUNTER — Ambulatory Visit (INDEPENDENT_AMBULATORY_CARE_PROVIDER_SITE_OTHER): Payer: Medicaid Other

## 2023-05-22 ENCOUNTER — Other Ambulatory Visit: Payer: Self-pay

## 2023-05-22 VITALS — BP 123/84 | HR 73 | Wt 158.8 lb

## 2023-05-22 DIAGNOSIS — Z3042 Encounter for surveillance of injectable contraceptive: Secondary | ICD-10-CM

## 2023-05-22 DIAGNOSIS — Z3202 Encounter for pregnancy test, result negative: Secondary | ICD-10-CM | POA: Diagnosis not present

## 2023-05-22 LAB — POCT PREGNANCY, URINE: Preg Test, Ur: NEGATIVE

## 2023-05-22 MED ORDER — MEDROXYPROGESTERONE ACETATE 150 MG/ML IM SUSY
150.0000 mg | PREFILLED_SYRINGE | Freq: Once | INTRAMUSCULAR | Status: AC
Start: 2023-05-22 — End: 2023-05-22
  Administered 2023-05-22: 150 mg via INTRAMUSCULAR

## 2023-05-22 NOTE — Progress Notes (Signed)
Samuel Germany here for Depo-Provera Injection. Last given 02/14/23. It has been [redacted]w[redacted]d since last injection. Needs UPT per protocol. UPT today is negative. Pt will use condoms x 2 weeks as back up method and check home UPT in 2 weeks for reassurance. Injection administered without complication. Patient will return in 3 months for next injection between 08/07/23 and 08/21/23. Next annual visit due 08/2023.   Marjo Bicker, RN 05/22/2023  2:08 PM

## 2023-08-07 ENCOUNTER — Ambulatory Visit (INDEPENDENT_AMBULATORY_CARE_PROVIDER_SITE_OTHER): Payer: Managed Care, Other (non HMO) | Admitting: *Deleted

## 2023-08-07 ENCOUNTER — Other Ambulatory Visit: Payer: Self-pay

## 2023-08-07 VITALS — BP 113/68 | HR 82 | Ht 65.0 in | Wt 159.3 lb

## 2023-08-07 DIAGNOSIS — Z3042 Encounter for surveillance of injectable contraceptive: Secondary | ICD-10-CM | POA: Diagnosis not present

## 2023-08-07 MED ORDER — MEDROXYPROGESTERONE ACETATE 150 MG/ML IM SUSY
150.0000 mg | PREFILLED_SYRINGE | Freq: Once | INTRAMUSCULAR | Status: AC
Start: 2023-08-07 — End: 2023-08-07
  Administered 2023-08-07: 150 mg via INTRAMUSCULAR

## 2023-08-07 NOTE — Progress Notes (Signed)
Here for depo-provera. Last given 05/22/23. Last annual exam 11/30/22. Last pap 08/15/22. Injection given without complaint. Advised to schedule next injection at checkout and will need annual exam 12/02/23 or after. She voices understanding. Nancy Fetter

## 2023-10-26 ENCOUNTER — Ambulatory Visit: Payer: Managed Care, Other (non HMO)

## 2023-11-23 ENCOUNTER — Other Ambulatory Visit: Payer: Self-pay

## 2023-11-23 ENCOUNTER — Ambulatory Visit: Payer: Managed Care, Other (non HMO) | Admitting: General Practice

## 2023-11-23 VITALS — BP 130/78 | HR 71 | Ht 65.0 in | Wt 166.0 lb

## 2023-11-23 DIAGNOSIS — Z3202 Encounter for pregnancy test, result negative: Secondary | ICD-10-CM

## 2023-11-23 DIAGNOSIS — Z3042 Encounter for surveillance of injectable contraceptive: Secondary | ICD-10-CM

## 2023-11-23 LAB — POCT PREGNANCY, URINE: Preg Test, Ur: NEGATIVE

## 2023-11-23 MED ORDER — MEDROXYPROGESTERONE ACETATE 150 MG/ML IM SUSY
150.0000 mg | PREFILLED_SYRINGE | Freq: Once | INTRAMUSCULAR | Status: AC
Start: 2023-11-23 — End: 2023-11-23
  Administered 2023-11-23: 150 mg via INTRAMUSCULAR

## 2023-11-23 NOTE — Progress Notes (Signed)
Shirley Navarro here for Depo-Provera Injection. Last injection was 15.3 weeks ago- UPT - today. Advised using back up for the next week and pregnancy test in two weeks. Injection administered without complication. Patient will return in 3 months for next injection between May 1 and May 15. Next annual visit due before or with next depo.   Marylynn Pearson, RN 11/23/2023  4:34 PM

## 2023-12-27 ENCOUNTER — Ambulatory Visit: Payer: Managed Care, Other (non HMO) | Admitting: Obstetrics & Gynecology

## 2024-02-08 ENCOUNTER — Ambulatory Visit

## 2024-02-08 ENCOUNTER — Ambulatory Visit: Payer: Managed Care, Other (non HMO)

## 2024-02-08 ENCOUNTER — Other Ambulatory Visit: Payer: Self-pay

## 2024-02-08 VITALS — BP 121/69 | HR 102 | Wt 162.2 lb

## 2024-02-08 DIAGNOSIS — Z3042 Encounter for surveillance of injectable contraceptive: Secondary | ICD-10-CM | POA: Diagnosis not present

## 2024-02-08 MED ORDER — MEDROXYPROGESTERONE ACETATE 150 MG/ML IM SUSY
150.0000 mg | PREFILLED_SYRINGE | Freq: Once | INTRAMUSCULAR | Status: AC
  Administered 2024-02-08: 150 mg via INTRAMUSCULAR

## 2024-02-08 NOTE — Progress Notes (Signed)
 Shirley Navarro here for Depo-Provera  Injection. Injection administered without complication. Patient will return in 3 months for next injection between July 17 and July 31. Next annual visit due. Pt advised to get schedule.   Petros Ahart, RN 02/08/2024  9:46 AM

## 2024-03-19 ENCOUNTER — Ambulatory Visit: Admitting: Obstetrics and Gynecology

## 2024-03-19 NOTE — Progress Notes (Deleted)
 ANNUAL EXAM Patient name: Shirley Navarro MRN [id number]  Date of birth: October 20, 1995 Chief Complaint:   No chief complaint on file.  History of Present Illness:   Shirley Navarro is a 28 y.o. G1P1001 being seen today for a routine annual exam.  Current complaints: ***  Menstrual concerns? {yes/no:20286}  *** Breast or nipple changes? {yes/no:20286} *** Contraception use? {yes/no:20286} *** Sexually active? {yes/no:20286} ***  No LMP recorded. Patient has had an injection.   The pregnancy intention screening data noted above was reviewed. Potential methods of contraception were discussed. The patient elected to proceed with No data recorded.   Last pap     Component Value Date/Time   DIAGPAP  08/15/2022 1635    - Negative for intraepithelial lesion or malignancy (NILM)   ADEQPAP  08/15/2022 1635    Satisfactory for evaluation; transformation zone component PRESENT.     H/O abnormal pap: {yes/yes***/no:23866} Last mammogram: ***.  Last colonoscopy: ***.      11/30/2022    8:54 AM 09/15/2022   11:50 AM 09/15/2022   11:48 AM 06/24/2020    9:53 AM 06/12/2020   10:16 AM  Depression screen PHQ 2/9  Decreased Interest 1 2 2  0 0  Down, Depressed, Hopeless 1 2 2  0 0  PHQ - 2 Score 2 4 4  0 0  Altered sleeping 0 1 1 1 1   Tired, decreased energy 2 1 1 1 1   Change in appetite 0 2 2 0 0  Feeling bad or failure about yourself  1 3 3  0 1  Trouble concentrating 0 2 2 0 0  Moving slowly or fidgety/restless 0 1 1 0 0  Suicidal thoughts 0 0 0 0 0  PHQ-9 Score 5 14 14 2 3   Difficult doing work/chores     Not difficult at all        11/30/2022    8:54 AM 09/15/2022   11:49 AM 06/12/2020   10:15 AM  GAD 7 : Generalized Anxiety Score  Nervous, Anxious, on Edge 1 2 1   Control/stop worrying 2 2 0  Worry too much - different things 2 3 1   Trouble relaxing 1 2 1   Restless 1 0 1  Easily annoyed or irritable 1 2 0  Afraid - awful might happen 0 1 1  Total GAD 7 Score 8 12 5    Anxiety Difficulty   Not difficult at all     Review of Systems:   Pertinent items are noted in HPI Denies any headaches, blurred vision, fatigue, shortness of breath, chest pain, abdominal pain, abnormal vaginal discharge/itching/odor/irritation, problems with periods, bowel movements, urination, or intercourse unless otherwise stated above. Pertinent History Reviewed:  Reviewed past medical,surgical, social and family history.  Reviewed problem list, medications and allergies. Physical Assessment:  There were no vitals filed for this visit.There is no height or weight on file to calculate BMI.        Physical Examination:   General appearance - well appearing, and in no distress  Mental status - alert, oriented to person, place, and time  Psych:  She has a normal mood and affect  Skin - warm and dry, normal color, no suspicious lesions noted  Chest - effort normal, all lung fields clear to auscultation bilaterally  Heart - normal rate and regular rhythm  Breasts - breasts appear normal, no suspicious masses, no skin or nipple changes or  axillary nodes  Abdomen - soft, nontender, nondistended, no masses or organomegaly  Pelvic -  VULVA: normal appearing vulva with no masses, tenderness or lesions   VAGINA: normal appearing vagina with normal color and discharge, no lesions   CERVIX: normal appearing cervix without discharge or lesions, no CMT  Thin prep pap is {Desc; done/not:10129} *** HR HPV cotesting  UTERUS: uterus is felt to be normal size, shape, consistency and nontender   ADNEXA: No adnexal masses or tenderness noted.  Extremities:  No swelling or varicosities noted  Chaperone present for exam  No results found for this or any previous visit (from the past 24 hours).    Assessment & Plan:  There are no diagnoses linked to this encounter.      No orders of the defined types were placed in this encounter.   Meds: No orders of the defined types were placed in  this encounter.   Follow-up: No follow-ups on file.  Kiki Pelton, MD 03/19/2024 1:11 PM

## 2024-04-29 ENCOUNTER — Ambulatory Visit

## 2024-10-17 ENCOUNTER — Encounter (HOSPITAL_COMMUNITY): Payer: Self-pay | Admitting: Obstetrics and Gynecology

## 2024-10-17 ENCOUNTER — Inpatient Hospital Stay (HOSPITAL_COMMUNITY)
Admission: AD | Admit: 2024-10-17 | Discharge: 2024-10-17 | Disposition: A | Discharge status: Home / Self Care | Attending: Obstetrics and Gynecology | Admitting: Obstetrics and Gynecology

## 2024-10-17 DIAGNOSIS — Z3A01 Less than 8 weeks gestation of pregnancy: Secondary | ICD-10-CM | POA: Diagnosis not present

## 2024-10-17 DIAGNOSIS — O219 Vomiting of pregnancy, unspecified: Secondary | ICD-10-CM | POA: Diagnosis not present

## 2024-10-17 LAB — CBC
HCT: 37.5 % (ref 36.0–46.0)
Hemoglobin: 12.7 g/dL (ref 12.0–15.0)
MCH: 26.3 pg (ref 26.0–34.0)
MCHC: 33.9 g/dL (ref 30.0–36.0)
MCV: 77.6 fL — ABNORMAL LOW (ref 80.0–100.0)
Platelets: 266 K/uL (ref 150–400)
RBC: 4.83 MIL/uL (ref 3.87–5.11)
RDW: 13.4 % (ref 11.5–15.5)
WBC: 8.5 K/uL (ref 4.0–10.5)
nRBC: 0 % (ref 0.0–0.2)

## 2024-10-17 LAB — URINALYSIS, ROUTINE W REFLEX MICROSCOPIC
Bilirubin Urine: NEGATIVE
Glucose, UA: NEGATIVE mg/dL
Hgb urine dipstick: NEGATIVE
Ketones, ur: 20 mg/dL — AB
Nitrite: NEGATIVE
Protein, ur: 30 mg/dL — AB
Specific Gravity, Urine: 1.024 (ref 1.005–1.030)
pH: 6 (ref 5.0–8.0)

## 2024-10-17 LAB — COMPREHENSIVE METABOLIC PANEL WITH GFR
ALT: 22 U/L (ref 0–44)
AST: 21 U/L (ref 15–41)
Albumin: 4.5 g/dL (ref 3.5–5.0)
Alkaline Phosphatase: 72 U/L (ref 38–126)
Anion gap: 14 (ref 5–15)
BUN: 11 mg/dL (ref 6–20)
CO2: 22 mmol/L (ref 22–32)
Calcium: 9.9 mg/dL (ref 8.9–10.3)
Chloride: 101 mmol/L (ref 98–111)
Creatinine, Ser: 0.77 mg/dL (ref 0.44–1.00)
GFR, Estimated: >60 mL/min
Glucose, Bld: 84 mg/dL (ref 70–99)
Potassium: 3.8 mmol/L (ref 3.5–5.1)
Sodium: 137 mmol/L (ref 135–145)
Total Bilirubin: 0.5 mg/dL (ref 0.0–1.2)
Total Protein: 7.6 g/dL (ref 6.5–8.1)

## 2024-10-17 LAB — POCT PREGNANCY, URINE: Preg Test, Ur: POSITIVE — AB

## 2024-10-17 LAB — WET PREP, GENITAL
Sperm: NONE SEEN
Trich, Wet Prep: NONE SEEN
WBC, Wet Prep HPF POC: <10
Yeast Wet Prep HPF POC: NONE SEEN

## 2024-10-17 MED ORDER — ONDANSETRON 4 MG PO TBDP: 4.0000 mg | ORAL_TABLET | Freq: Three times a day (TID) | ORAL | 0 refills | Status: DC | PRN

## 2024-10-17 MED ORDER — LACTATED RINGERS IV BOLUS
1000.0000 mL | Freq: Once | INTRAVENOUS | Status: AC
  Administered 2024-10-17: 1000 mL via INTRAVENOUS

## 2024-10-17 MED ORDER — ONDANSETRON 4 MG PO TBDP
4.0000 mg | ORAL_TABLET | Freq: Once | ORAL | Status: AC
  Administered 2024-10-17: 4 mg via ORAL
  Filled 2024-10-17: qty 1

## 2024-10-17 NOTE — Discharge Instructions (Signed)
 It was a pleasure taking care of you!  I am glad your medications helped with the nausea.  I sent a prescription for Zofran  to your pharmacy.  If your symptoms worsen or you have any other concerns please return for further evaluation.  Hope you have a great rest of your day!

## 2024-10-17 NOTE — MAU Note (Signed)
 Shirley Navarro is a 29 y.o. at Unknown here in MAU reporting: v/v for about 2 weeks . Now unable to keep anything down. No meds . Mild cramping with vomiting. Denies any vag bleeding or discharge. Has 1st  prenatal appointment today  LMP: 08/26/2024 Onset of complaint: 2 weeks Pain score: 0 Vitals:   10/17/24 1123  BP: 130/80  Pulse: 75  Resp: 18  Temp: 98 F (36.7 C)     FHT: n/a  Lab orders placed from triage: u/a, wet prep and gc

## 2024-10-17 NOTE — MAU Provider Note (Signed)
 " History     CSN: [id number]  Arrival date and time: 10/17/24 1030   None     Chief Complaint  Patient presents with   Nausea   Emesis   HPI Patient presented to the MAU for nausea and vomiting for the past few weeks but worse over the past few days.  Reports has been unable to keep anything down for the last 2 or 3 days.  Reports that she was sent patches for nausea but has not taken any medications.  Reports mild pressure in her pelvis but no bleeding or discharge.  Denies any other symptoms.   OB History     Gravida  2   Para  1   Term  1   Preterm      AB      Living  1      SAB      IAB      Ectopic      Multiple  0   Live Births  1           Past Medical History:  Diagnosis Date   Asthma    Sickle cell trait in mother affecting pregnancy 06/24/2020    Past Surgical History:  Procedure Laterality Date   NO PAST SURGERIES      Family History  Problem Relation Age of Onset   Healthy Mother    Healthy Father    Breast cancer Paternal Aunt    Diabetes Maternal Grandmother    Breast cancer Paternal Grandmother     Social History[1]  Allergies: Allergies[2]  Medications Prior to Admission  Medication Sig Dispense Refill Last Dose/Taking   Prenatal Vit-Fe Fumarate-FA (MULTIVITAMIN-PRENATAL) 27-0.8 MG TABS tablet Take 1 tablet by mouth daily at 12 noon.   10/14/2024   albuterol  (VENTOLIN  HFA) 108 (90 Base) MCG/ACT inhaler Inhale 1-2 puffs into the lungs every 6 (six) hours as needed for wheezing or shortness of breath. (Patient not taking: Reported on 02/08/2024) 1 each 0    escitalopram (LEXAPRO) 10 MG tablet Take 10 mg by mouth daily.      fluticasone  (FLONASE ) 50 MCG/ACT nasal spray Place 1 spray into both nostrils daily.      ibuprofen  (ADVIL ) 800 MG tablet Take 1 tablet (800 mg total) by mouth every 8 (eight) hours as needed. 60 tablet 1    SYMBICORT 80-4.5 MCG/ACT inhaler SMARTSIG:2 Puff(s) By Mouth Twice Daily       Review of Systems   Gastrointestinal:  Positive for vomiting.  All other systems reviewed and are negative.  Physical Exam   Blood pressure 102/61, pulse 61, temperature 98 F (36.7 C), resp. rate 18, height 5' 5 (1.651 m), weight 68.5 kg, last menstrual period 08/26/2024, SpO2 99%.  Physical Exam Vitals and nursing note reviewed.  Constitutional:      Appearance: Normal appearance.  HENT:     Head: Normocephalic and atraumatic.     Nose: No congestion or rhinorrhea.  Eyes:     Extraocular Movements: Extraocular movements intact.  Cardiovascular:     Rate and Rhythm: Normal rate.  Pulmonary:     Effort: Pulmonary effort is normal.  Abdominal:     Palpations: Abdomen is soft.     Tenderness: There is no abdominal tenderness.  Musculoskeletal:        General: Normal range of motion.     Cervical back: Normal range of motion.  Skin:    General: Skin is warm.     Capillary Refill:  Capillary refill takes less than 2 seconds.  Neurological:     General: No focal deficit present.     Mental Status: She is alert.     Cranial Nerves: No cranial nerve deficit.  Psychiatric:        Mood and Affect: Mood normal.        Behavior: Behavior normal.     MAU Course  Procedures  MDM CBC CMP LR fluid bolus ODT Zofran   Assessment and Plan  Shirley Navarro Shirley Navarro is a 29 year old G2P1 @ 7 weeks Presenting for nausea and vomiting.   Nausea and vomiting in pregnancy Patient presents with nausea and vomiting for the past few weeks and unable to keep anything down for the past few days. Vital signs stable. Bedside US  showing IUP with FHR 148. ODT zofran  given and LR fluid bolus. Improvement in symptoms. Discussed return precautions. Prescription sent for ODT zofran . Patient discharged home.   Shirley Navarro 10/17/2024, 3:18 PM     [1]  Social History Tobacco Use   Smoking status: Never   Smokeless tobacco: Never  Vaping Use   Vaping status: Never Used  Substance Use Topics   Alcohol use: Not  Currently    Comment: Socially   Drug use: Not Currently    Types: Marijuana    Comment: Last smoked  December 2025  [2]  Allergies Allergen Reactions   Codeine Rash   "

## 2024-10-18 LAB — GC/CHLAMYDIA PROBE AMP (~~LOC~~) NOT AT ARMC
Chlamydia: NEGATIVE
Comment: NEGATIVE
Comment: NORMAL
Neisseria Gonorrhea: NEGATIVE

## 2024-11-02 ENCOUNTER — Inpatient Hospital Stay (HOSPITAL_COMMUNITY)
Admission: AD | Admit: 2024-11-02 | Discharge: 2024-11-02 | Disposition: A | Discharge status: Home / Self Care | Attending: Obstetrics and Gynecology | Admitting: Obstetrics and Gynecology

## 2024-11-02 ENCOUNTER — Encounter (HOSPITAL_COMMUNITY): Payer: Self-pay | Admitting: Obstetrics and Gynecology

## 2024-11-02 ENCOUNTER — Other Ambulatory Visit: Payer: Self-pay | Admitting: Certified Nurse Midwife

## 2024-11-02 DIAGNOSIS — O210 Mild hyperemesis gravidarum: Secondary | ICD-10-CM | POA: Insufficient documentation

## 2024-11-02 DIAGNOSIS — K117 Disturbances of salivary secretion: Secondary | ICD-10-CM | POA: Insufficient documentation

## 2024-11-02 DIAGNOSIS — Z3A01 Less than 8 weeks gestation of pregnancy: Secondary | ICD-10-CM | POA: Insufficient documentation

## 2024-11-02 DIAGNOSIS — Z3A09 9 weeks gestation of pregnancy: Secondary | ICD-10-CM

## 2024-11-02 DIAGNOSIS — O99611 Diseases of the digestive system complicating pregnancy, first trimester: Secondary | ICD-10-CM | POA: Insufficient documentation

## 2024-11-02 LAB — URINALYSIS, ROUTINE W REFLEX MICROSCOPIC
Bilirubin Urine: NEGATIVE
Glucose, UA: NEGATIVE mg/dL
Hgb urine dipstick: NEGATIVE
Ketones, ur: 80 mg/dL — AB
Nitrite: NEGATIVE
Protein, ur: 30 mg/dL — AB
Specific Gravity, Urine: 1.025 (ref 1.005–1.030)
pH: 5 (ref 5.0–8.0)

## 2024-11-02 MED ORDER — GLYCOPYRROLATE 0.2 MG/ML IJ SOLN
0.2000 mg | Freq: Once | INTRAMUSCULAR | Status: AC
  Administered 2024-11-02: 0.2 mg via INTRAVENOUS
  Filled 2024-11-02: qty 1

## 2024-11-02 MED ORDER — LACTATED RINGERS IV BOLUS
1000.0000 mL | Freq: Once | INTRAVENOUS | Status: AC
  Administered 2024-11-02: 1000 mL via INTRAVENOUS

## 2024-11-02 MED ORDER — GLYCOPYRROLATE 1 MG PO TABS: 1.0000 mg | ORAL_TABLET | Freq: Three times a day (TID) | ORAL | 0 refills | Status: AC

## 2024-11-02 MED ORDER — ONDANSETRON 4 MG PO TBDP: 4.0000 mg | ORAL_TABLET | Freq: Three times a day (TID) | ORAL | 0 refills | Status: AC | PRN

## 2024-11-02 MED ORDER — DEXAMETHASONE SOD PHOSPHATE PF 10 MG/ML IJ SOLN
10.0000 mg | Freq: Once | INTRAMUSCULAR | Status: AC
  Administered 2024-11-02: 10 mg via INTRAVENOUS
  Filled 2024-11-02: qty 1

## 2024-11-02 MED ORDER — FAMOTIDINE IN NACL 20-0.9 MG/50ML-% IV SOLN
20.0000 mg | Freq: Once | INTRAVENOUS | Status: AC
  Administered 2024-11-02: 20 mg via INTRAVENOUS
  Filled 2024-11-02: qty 50

## 2024-11-02 MED ORDER — ONDANSETRON HCL 4 MG/2ML IJ SOLN
4.0000 mg | Freq: Once | INTRAMUSCULAR | Status: AC
  Administered 2024-11-02: 4 mg via INTRAVENOUS
  Filled 2024-11-02: qty 2

## 2024-11-02 MED ORDER — SCOPOLAMINE 1 MG/3DAYS TD PT72: 1.0000 | MEDICATED_PATCH | TRANSDERMAL | 12 refills | Status: AC

## 2024-11-02 MED ORDER — METOCLOPRAMIDE HCL 10 MG PO TABS: 10.0000 mg | ORAL_TABLET | Freq: Four times a day (QID) | ORAL | 0 refills | Status: AC | PRN

## 2024-11-02 NOTE — Progress Notes (Signed)
 Orders entered for 2 hydration/med infusions for hyperemesis.  Cornell Finder, CNM, MSN, IBCLC Certified Nurse Midwife, [facility]

## 2024-11-02 NOTE — MAU Provider Note (Cosign Needed Addendum)
 Chief Complaint:  Emesis and Nausea  HPI   Event Date/Time   First Provider Initiated Contact with Patient 11/02/24 1924     Shirley Navarro is a 29 y.o. G2P1001 at [redacted]w[redacted]d who presents to maternity admissions reporting nausea/vomiting with inability to keep anything down x4 days. Also spitting and is now feeling weak, cold and exhausted. Denies vaginal bleeding, cramping or any other physical symptoms.  Pregnancy Course: Goes to [facility], has not established are yet - was last seen there in May for Depo injection.  Past Medical History:  Diagnosis Date   Asthma    Sickle cell trait in mother affecting pregnancy 06/24/2020   OB History  Gravida Para Term Preterm AB Living  2 1 1   1   SAB IAB Ectopic Multiple Live Births     0 1    # Outcome Date GA Lbr Len/2nd Weight Sex Type Anes PTL Lv  2 Current           1 Term 09/19/20 [redacted]w[redacted]d 07:30 / 02:13 3975 g F Vag-Spont EPI  LIV     Birth Comments: WDL   Past Surgical History:  Procedure Laterality Date   NO PAST SURGERIES     Family History  Problem Relation Age of Onset   Healthy Mother    Healthy Father    Breast cancer Paternal Aunt    Diabetes Maternal Grandmother    Breast cancer Paternal Grandmother    Social History[1] Allergies[2] Medications Prior to Admission  Medication Sig Dispense Refill Last Dose/Taking   [DISCONTINUED] ondansetron  (ZOFRAN -ODT) 4 MG disintegrating tablet Take 1 tablet (4 mg total) by mouth every 8 (eight) hours as needed for nausea or vomiting. 20 tablet 0 11/02/2024 at 11:00 AM   [DISCONTINUED] scopolamine  (TRANSDERM-SCOP) 1 MG/3DAYS Place 1 patch onto the skin every 3 (three) days.   11/02/2024   albuterol  (VENTOLIN  HFA) 108 (90 Base) MCG/ACT inhaler Inhale 1-2 puffs into the lungs every 6 (six) hours as needed for wheezing or shortness of breath. (Patient not taking: Reported on 02/08/2024) 1 each 0    Prenatal Vit-Fe Fumarate-FA (MULTIVITAMIN-PRENATAL) 27-0.8 MG TABS tablet Take 1 tablet by mouth  daily at 12 noon.      I have reviewed patient's Past Medical Hx, Surgical Hx, Family Hx, Social Hx, medications and allergies.   ROS  Pertinent items noted in HPI and remainder of comprehensive ROS otherwise negative.   PHYSICAL EXAM  Patient Vitals for the past 24 hrs:  BP Temp Pulse Resp SpO2 Height Weight  11/02/24 1912 102/64 -- 90 18 -- -- --  11/02/24 1910 -- -- -- -- 97 % -- --  11/02/24 1908 -- 98 F (36.7 C) -- 18 100 % 5' 5 (1.651 m) 66.6 kg   Constitutional: Well-developed, well-nourished female in no acute distress.  Cardiovascular: normal rate & rhythm, warm and well-perfused Respiratory: normal effort, no problems with respiration noted GI: Abd soft, non-tender, non-distended MS: Extremities nontender, no edema, normal ROM Neurologic: Alert and oriented x 4.  GU: no CVA tenderness Pelvic: exam deferred   Labs: Results for orders placed or performed during the hospital encounter of 11/02/24 (from the past 24 hours)  Urinalysis, Routine w reflex microscopic -Urine, Clean Catch     Status: Abnormal   Collection Time: 11/02/24  7:14 PM  Result Value Ref Range   Color, Urine AMBER (A) YELLOW   APPearance HAZY (A) CLEAR   Specific Gravity, Urine 1.025 1.005 - 1.030   pH 5.0  5.0 - 8.0   Glucose, UA NEGATIVE NEGATIVE mg/dL   Hgb urine dipstick NEGATIVE NEGATIVE   Bilirubin Urine NEGATIVE NEGATIVE   Ketones, ur 80 (A) NEGATIVE mg/dL   Protein, ur 30 (A) NEGATIVE mg/dL   Nitrite NEGATIVE NEGATIVE   Leukocytes,Ua TRACE (A) NEGATIVE   RBC / HPF 0-5 0 - 5 RBC/hpf   WBC, UA 0-5 0 - 5 WBC/hpf   Bacteria, UA RARE (A) NONE SEEN   Squamous Epithelial / HPF 6-10 0 - 5 /HPF   Mucus PRESENT    Imaging:  No results found.  MDM & MAU COURSE  MDM: Moderate  MAU Course: Orders Placed This Encounter  Procedures   Urinalysis, Routine w reflex microscopic -Urine, Clean Catch   Discharge patient   Meds ordered this encounter  Medications   lactated ringers  bolus 1,000  mL   ondansetron  (ZOFRAN ) injection 4 mg   famotidine  (PEPCID ) IVPB 20 mg premix   dexamethasone  (DECADRON ) injection 10 mg   glycopyrrolate  (ROBINUL ) injection 0.2 mg   glycopyrrolate  (ROBINUL ) 1 MG tablet    Sig: Take 1 tablet (1 mg total) by mouth 3 (three) times daily.    Dispense:  90 tablet    Refill:  0   lactated ringers  bolus 1,000 mL   scopolamine  (TRANSDERM-SCOP) 1 MG/3DAYS    Sig: Place 1 patch (1 mg total) onto the skin every 3 (three) days.    Dispense:  10 patch    Refill:  12   ondansetron  (ZOFRAN -ODT) 4 MG disintegrating tablet    Sig: Take 1-2 tablets (4-8 mg total) by mouth every 8 (eight) hours as needed for nausea or vomiting.    Dispense:  20 tablet    Refill:  0   metoCLOPramide  (REGLAN ) 10 MG tablet    Sig: Take 1 tablet (10 mg total) by mouth every 6 (six) hours as needed for nausea.    Dispense:  30 tablet    Refill:  0   Meds and fluids ordered, robinul  given but also sent to pharmacy. IV infusions (2) ordered to try and prevent recurrence of intractable vomiting Care turned over to Joesph Sear, PA-C at 2000  Cornell Finder, CNM, MSN, [employer] Certified Nurse Midwife, [facility]  -Care assumed at 2000 by Joesph Sear PA-C. -Symptoms improved after 2L IV fluids, Zofran , Pepcid , Robinul , and dexamethasone . -Sent refills of antiemetic medication to pharmacy. She has a scopolamine  patch in place which she put on this morning.  ASSESSMENT   1. Hyperemesis of pregnancy   2. Ptyalism   3. [redacted] weeks gestation of pregnancy    PLAN  Discharge from MAU in stable condition with return precautions. Orders placed for outpatient IV fluid infusions. Call [facility] or OB of choice to establish prenatal care. Continue scopolamine  patch every 3 days for nausea prevention. Refill sent to pharmacy. Zofran  prn for breakthrough nausea. Refill sent to pharmacy. Robinul  sent to pharmacy.  Recommend to start famotidine  20 mg daily for nausea.  Allergies as  of 11/02/2024 - Review Complete 11/02/2024  Allergen Reaction Noted   Codeine Rash 05/06/2018    Joesph DELENA Sear, PA     [1]  Social History Tobacco Use   Smoking status: Never   Smokeless tobacco: Never  Vaping Use   Vaping status: Never Used  Substance Use Topics   Alcohol use: Not Currently    Comment: Socially   Drug use: Not Currently    Types: Marijuana    Comment: Last smoked  December 2025  [2]  Allergies Allergen Reactions   Codeine Rash

## 2024-11-02 NOTE — Discharge Instructions (Addendum)
 We recommend calling the OB office of your choice to get scheduled for prenatal care!  NAUSEA: NAUSEA PREVENTION CONTINUE using a scopolamine  patch every 3 days to prevent nausea. START famotidine  (Pepcid ) 20 mg once daily. This can also help to decrease nausea by keeping stomach acid at a lower level. START glycopyrrolate  (Robinul ) to reduce saliva production. We recommend eating within an hour of getting up, some people even eat a few crackers before getting out of bed. Then make sure you eat at least something small every 2-3 hours, focusing on protein and bland foods. Going too long between meals can make the nausea worse. Also, try not to drink a lot of fluids on an empty stomach if you feel nauseas, this may just make it worse! Peppermint essential oil: you can keep this with you to smell or put a little behind your ears when you're nauseas Ginger candy: I specifically like the [employer] Medicinals Belly Comfort. You can search online for morning sickness drops/candy and find other things! You can also try Ginger Ale or ginger teas. Frozen fruits or popsicles NAUSEA TREATMENT For breakthrough nausea not controlled by preventative measures, you can use Zofran  4-8 mg every 8 hours. If needed, you can also take metoclopramide  (Reglan ) 10 mg every 6 hours. It is safe to take at the same time as Zofran . If you cannot keep anything down, you can place Reglan  intravaginally.

## 2024-11-02 NOTE — MAU Note (Signed)
 Shirley Navarro is a 30 y.o. at [redacted]w[redacted]d here in MAU reporting: she has not been able to keep anything down for the last 4 days, unable to keep meds down.  Onset of complaint: 4 days Pain score: 5/10 mid abd 6/10 throat There were no vitals filed for this visit.   FHT: na   Lab orders placed from triage: ua

## 2024-11-03 ENCOUNTER — Encounter (HOSPITAL_COMMUNITY): Payer: Self-pay | Admitting: Obstetrics and Gynecology

## 2024-11-03 ENCOUNTER — Inpatient Hospital Stay (HOSPITAL_COMMUNITY)
Admission: AD | Admit: 2024-11-03 | Discharge: 2024-11-04 | Disposition: A | Attending: Obstetrics and Gynecology | Admitting: Obstetrics and Gynecology

## 2024-11-03 DIAGNOSIS — K117 Disturbances of salivary secretion: Secondary | ICD-10-CM

## 2024-11-03 DIAGNOSIS — R112 Nausea with vomiting, unspecified: Secondary | ICD-10-CM | POA: Diagnosis not present

## 2024-11-03 DIAGNOSIS — Z3A10 10 weeks gestation of pregnancy: Secondary | ICD-10-CM | POA: Insufficient documentation

## 2024-11-03 DIAGNOSIS — O219 Vomiting of pregnancy, unspecified: Secondary | ICD-10-CM | POA: Insufficient documentation

## 2024-11-03 DIAGNOSIS — O26891 Other specified pregnancy related conditions, first trimester: Secondary | ICD-10-CM | POA: Diagnosis not present

## 2024-11-03 LAB — COMPREHENSIVE METABOLIC PANEL WITH GFR
ALT: 50 U/L — ABNORMAL HIGH (ref 0–44)
AST: 36 U/L (ref 15–41)
Albumin: 4.3 g/dL (ref 3.5–5.0)
Alkaline Phosphatase: 66 U/L (ref 38–126)
Anion gap: 15 (ref 5–15)
BUN: 9 mg/dL (ref 6–20)
CO2: 22 mmol/L (ref 22–32)
Calcium: 9.7 mg/dL (ref 8.9–10.3)
Chloride: 97 mmol/L — ABNORMAL LOW (ref 98–111)
Creatinine, Ser: 0.65 mg/dL (ref 0.44–1.00)
GFR, Estimated: >60 mL/min
Glucose, Bld: 99 mg/dL (ref 70–99)
Potassium: 2.9 mmol/L — ABNORMAL LOW (ref 3.5–5.1)
Sodium: 134 mmol/L — ABNORMAL LOW (ref 135–145)
Total Bilirubin: 0.8 mg/dL (ref 0.0–1.2)
Total Protein: 7.2 g/dL (ref 6.5–8.1)

## 2024-11-03 LAB — URINALYSIS, ROUTINE W REFLEX MICROSCOPIC
Bilirubin Urine: NEGATIVE
Glucose, UA: NEGATIVE mg/dL
Hgb urine dipstick: NEGATIVE
Ketones, ur: 80 mg/dL — AB
Nitrite: NEGATIVE
Protein, ur: 30 mg/dL — AB
Specific Gravity, Urine: 1.017 (ref 1.005–1.030)
pH: 8 (ref 5.0–8.0)

## 2024-11-03 LAB — CBC WITH DIFFERENTIAL/PLATELET
Abs Immature Granulocytes: 0.03 10*3/uL (ref 0.00–0.07)
Basophils Absolute: 0 10*3/uL (ref 0.0–0.1)
Basophils Relative: 0 %
Eosinophils Absolute: 0 10*3/uL (ref 0.0–0.5)
Eosinophils Relative: 0 %
HCT: 34.8 % — ABNORMAL LOW (ref 36.0–46.0)
Hemoglobin: 12.4 g/dL (ref 12.0–15.0)
Immature Granulocytes: 0 %
Lymphocytes Relative: 14 %
Lymphs Abs: 1.2 10*3/uL (ref 0.7–4.0)
MCH: 26.7 pg (ref 26.0–34.0)
MCHC: 35.6 g/dL (ref 30.0–36.0)
MCV: 74.8 fL — ABNORMAL LOW (ref 80.0–100.0)
Monocytes Absolute: 0.6 10*3/uL (ref 0.1–1.0)
Monocytes Relative: 6 %
Neutro Abs: 6.9 10*3/uL (ref 1.7–7.7)
Neutrophils Relative %: 80 %
Platelets: 253 10*3/uL (ref 150–400)
RBC: 4.65 MIL/uL (ref 3.87–5.11)
RDW: 13.5 % (ref 11.5–15.5)
WBC: 8.7 10*3/uL (ref 4.0–10.5)
nRBC: 0 % (ref 0.0–0.2)

## 2024-11-03 MED ORDER — ONDANSETRON 4 MG PO TBDP
4.0000 mg | ORAL_TABLET | Freq: Once | ORAL | Status: AC
  Administered 2024-11-04: 4 mg via ORAL
  Filled 2024-11-03: qty 1

## 2024-11-03 MED ORDER — LACTATED RINGERS IV BOLUS
1000.0000 mL | Freq: Once | INTRAVENOUS | Status: AC
  Administered 2024-11-03: 1000 mL via INTRAVENOUS

## 2024-11-03 MED ORDER — SODIUM CHLORIDE 0.9 % IV SOLN
8.0000 mg | Freq: Once | INTRAVENOUS | Status: AC
  Administered 2024-11-03: 8 mg via INTRAVENOUS
  Filled 2024-11-03: qty 4

## 2024-11-03 MED ORDER — ONDANSETRON HCL 4 MG/2ML IJ SOLN: 4.0000 mg | Freq: Once | INTRAMUSCULAR | Status: DC

## 2024-11-03 MED ORDER — LACTATED RINGERS IV SOLN: INTRAVENOUS | Status: DC

## 2024-11-03 MED ORDER — GLYCOPYRROLATE 0.2 MG/ML IJ SOLN
0.2000 mg | Freq: Once | INTRAMUSCULAR | Status: AC
  Administered 2024-11-03: 0.2 mg via INTRAVENOUS
  Filled 2024-11-03: qty 1

## 2024-11-03 MED ORDER — POTASSIUM CHLORIDE 10 MEQ/100ML IV SOLN
10.0000 meq | INTRAVENOUS | Status: AC
  Administered 2024-11-03 (×2): 10 meq via INTRAVENOUS
  Filled 2024-11-03 (×2): qty 100

## 2024-11-03 MED ORDER — FAMOTIDINE IN NACL 20-0.9 MG/50ML-% IV SOLN
20.0000 mg | Freq: Once | INTRAVENOUS | Status: AC
  Administered 2024-11-03: 20 mg via INTRAVENOUS
  Filled 2024-11-03: qty 50

## 2024-11-03 NOTE — MAU Note (Signed)
..  Hunter IVAR FABIENE Mariel is a 29 y.o. at [redacted]w[redacted]d here in MAU reporting: nausea and vomiting, was here in MAU last night and felt some relief but when she got back home it came back. reports she attempted to take zofran  but it did not work and was not able to pick up the rest of her medications.  Also reporting lower back pain that feels like an ache, position changes help with the pain.  As well as upper abdominal pain that feels like burning.  Denies vaginal bleeding or lower abdominal pain.  Pain score: back 8/10; abdominal 9/10 Vitals:   11/03/24 1956  BP: 138/76  Pulse: (!) 102  Resp: 19  Temp: 98.5 F (36.9 C)  SpO2: 100%     FHT:n/a Lab orders placed from triage:  UA, pt unable to void at this time. RN made aware.

## 2024-11-03 NOTE — MAU Provider Note (Signed)
 " History     CSN: [id number]  Arrival date and time: 11/03/24 1946   Event Date/Time   First Provider Initiated Contact with Patient 11/03/24 2020      Chief Complaint  Patient presents with   Emesis   Shirley Navarro , a  29 y.o. G2P1001 at [redacted]w[redacted]d presents to MAU via EMS with complaints of on-going nausea and vomiting. Patient states that she attempted to take Zofran  this morning, at 0900 but threw up before the tablet fully dissolved. She has not attempted any other antiemetics since then. She endorses 5-6 episodes of emesis throughout the day today. Was rx'd medication however the pharmacy has been closed due to the weather and she has not been able to pick it up. Patients mother at the bedside contributing to HPI.      Patient was recently seen in MAU for similar complaints.     OB History     Gravida  2   Para  1   Term  1   Preterm      AB      Living  1      SAB      IAB      Ectopic      Multiple  0   Live Births  1           Past Medical History:  Diagnosis Date   Asthma    Sickle cell trait in mother affecting pregnancy 06/24/2020    Past Surgical History:  Procedure Laterality Date   NO PAST SURGERIES      Family History  Problem Relation Age of Onset   Healthy Mother    Healthy Father    Breast cancer Paternal Aunt    Diabetes Maternal Grandmother    Breast cancer Paternal Grandmother     Social History[1]  Allergies: Allergies[2]  Medications Prior to Admission  Medication Sig Dispense Refill Last Dose/Taking   ondansetron  (ZOFRAN -ODT) 4 MG disintegrating tablet Take 1-2 tablets (4-8 mg total) by mouth every 8 (eight) hours as needed for nausea or vomiting. 20 tablet 0 11/03/2024   albuterol  (VENTOLIN  HFA) 108 (90 Base) MCG/ACT inhaler Inhale 1-2 puffs into the lungs every 6 (six) hours as needed for wheezing or shortness of breath. (Patient not taking: Reported on 02/08/2024) 1 each 0    glycopyrrolate  (ROBINUL ) 1 MG  tablet Take 1 tablet (1 mg total) by mouth 3 (three) times daily. 90 tablet 0    metoCLOPramide  (REGLAN ) 10 MG tablet Take 1 tablet (10 mg total) by mouth every 6 (six) hours as needed for nausea. 30 tablet 0    Prenatal Vit-Fe Fumarate-FA (MULTIVITAMIN-PRENATAL) 27-0.8 MG TABS tablet Take 1 tablet by mouth daily at 12 noon.      scopolamine  (TRANSDERM-SCOP) 1 MG/3DAYS Place 1 patch (1 mg total) onto the skin every 3 (three) days. 10 patch 12     Review of Systems  Constitutional:  Negative for chills, fatigue and fever.  Eyes:  Negative for pain and visual disturbance.  Respiratory:  Negative for apnea, shortness of breath and wheezing.   Cardiovascular:  Negative for chest pain and palpitations.  Gastrointestinal:  Positive for nausea and vomiting. Negative for abdominal pain, constipation and diarrhea.  Genitourinary:  Negative for difficulty urinating, dysuria, pelvic pain, vaginal bleeding, vaginal discharge and vaginal pain.  Musculoskeletal:  Negative for back pain.  Neurological:  Negative for seizures, weakness and headaches.  Psychiatric/Behavioral:  Negative for suicidal ideas.  Physical Exam   Blood pressure 138/76, pulse (!) 102, temperature 98.5 F (36.9 C), temperature source Oral, resp. rate 19, height 5' 5 (1.651 m), weight 67.7 kg, last menstrual period 08/26/2024, SpO2 100%.  Physical Exam Vitals and nursing note reviewed.  Constitutional:      General: She is not in acute distress.    Appearance: Normal appearance. She is ill-appearing.  HENT:     Head: Normocephalic.  Cardiovascular:     Rate and Rhythm: Normal rate and regular rhythm.  Pulmonary:     Effort: Pulmonary effort is normal.  Abdominal:     Palpations: Abdomen is soft.     Tenderness: There is no abdominal tenderness.  Musculoskeletal:     Cervical back: Normal range of motion.  Skin:    General: Skin is warm and dry.  Neurological:     Mental Status: She is alert and oriented to person,  place, and time.  Psychiatric:        Mood and Affect: Mood normal.     MAU Course  Procedures Orders Placed This Encounter  Procedures   Urinalysis, Routine w reflex microscopic -Urine, Clean Catch   CBC with Differential/Platelet   Comprehensive metabolic panel   Insert peripheral IV   Meds ordered this encounter  Medications   lactated ringers  bolus 1,000 mL   glycopyrrolate  (ROBINUL ) injection 0.2 mg   famotidine  (PEPCID ) IVPB 20 mg premix   DISCONTD: ondansetron  (ZOFRAN ) injection 4 mg   ondansetron  (ZOFRAN ) 8 mg in sodium chloride  0.9 % 50 mL IVPB   ondansetron  (ZOFRAN -ODT) disintegrating tablet 4 mg   potassium chloride  10 mEq in 100 mL IVPB   lactated ringers  infusion   Results for orders placed or performed during the hospital encounter of 11/03/24 (from the past 24 hours)  CBC with Differential/Platelet     Status: Abnormal   Collection Time: 11/03/24  8:45 PM  Result Value Ref Range   WBC 8.7 4.0 - 10.5 K/uL   RBC 4.65 3.87 - 5.11 MIL/uL   Hemoglobin 12.4 12.0 - 15.0 g/dL   HCT 65.1 (L) 63.9 - 53.9 %   MCV 74.8 (L) 80.0 - 100.0 fL   MCH 26.7 26.0 - 34.0 pg   MCHC 35.6 30.0 - 36.0 g/dL   RDW 86.4 88.4 - 84.4 %   Platelets 253 150 - 400 K/uL   nRBC 0.0 0.0 - 0.2 %   Neutrophils Relative % 80 %   Neutro Abs 6.9 1.7 - 7.7 K/uL   Lymphocytes Relative 14 %   Lymphs Abs 1.2 0.7 - 4.0 K/uL   Monocytes Relative 6 %   Monocytes Absolute 0.6 0.1 - 1.0 K/uL   Eosinophils Relative 0 %   Eosinophils Absolute 0.0 0.0 - 0.5 K/uL   Basophils Relative 0 %   Basophils Absolute 0.0 0.0 - 0.1 K/uL   Immature Granulocytes 0 %   Abs Immature Granulocytes 0.03 0.00 - 0.07 K/uL  Comprehensive metabolic panel     Status: Abnormal   Collection Time: 11/03/24  8:45 PM  Result Value Ref Range   Sodium 134 (L) 135 - 145 mmol/L   Potassium 2.9 (L) 3.5 - 5.1 mmol/L   Chloride 97 (L) 98 - 111 mmol/L   CO2 22 22 - 32 mmol/L   Glucose, Bld 99 70 - 99 mg/dL   BUN 9 6 - 20 mg/dL    Creatinine, Ser 9.34 0.44 - 1.00 mg/dL   Calcium  9.7 8.9 - 10.3 mg/dL  Total Protein 7.2 6.5 - 8.1 g/dL   Albumin 4.3 3.5 - 5.0 g/dL   AST 36 15 - 41 U/L   ALT 50 (H) 0 - 44 U/L   Alkaline Phosphatase 66 38 - 126 U/L   Total Bilirubin 0.8 0.0 - 1.2 mg/dL   GFR, Estimated >39 >39 mL/min   Anion gap 15 5 - 15  Urinalysis, Routine w reflex microscopic -Urine, Clean Catch     Status: Abnormal   Collection Time: 11/03/24  8:56 PM  Result Value Ref Range   Color, Urine YELLOW YELLOW   APPearance HAZY (A) CLEAR   Specific Gravity, Urine 1.017 1.005 - 1.030   pH 8.0 5.0 - 8.0   Glucose, UA NEGATIVE NEGATIVE mg/dL   Hgb urine dipstick NEGATIVE NEGATIVE   Bilirubin Urine NEGATIVE NEGATIVE   Ketones, ur 80 (A) NEGATIVE mg/dL   Protein, ur 30 (A) NEGATIVE mg/dL   Nitrite NEGATIVE NEGATIVE   Leukocytes,Ua TRACE (A) NEGATIVE   RBC / HPF 0-5 0 - 5 RBC/hpf   WBC, UA 0-5 0 - 5 WBC/hpf   Bacteria, UA RARE (A) NONE SEEN   Squamous Epithelial / HPF 6-10 0 - 5 /HPF   Mucus PRESENT      MDM - Fluids and antiemetics given.  - Consult to pharmamcy for access to meds for until able to get meds. Pt was sent 4 additional zofran  tabs to go home with.  - Requesting Sprite. Attempt at PO challenge.  - Potassium low in MAU. IV potassium ordered.  - Phenergan  ordered follow unsuccessful PO challenge.  - No additional episodes of emesis following Phenergan .  - plan for discharge.   Assessment and Plan   1. Ptyalism   2. Nausea and vomiting, unspecified vomiting type   3. [redacted] weeks gestation of pregnancy    - Rx for robinul  sent to outpatient pharmacy for pick up.  - Reviewed worsening signs and return precautions - Discussed vomiting expectations and how to admin meds at home.  - Patient discharged home in stable condition and may return to MAU as needed.   Claris CHRISTELLA Cedar, MSN CNM  11/03/2024, 8:20 PM      [1]  Social History Tobacco Use   Smoking status: Never   Smokeless tobacco: Never   Vaping Use   Vaping status: Never Used  Substance Use Topics   Alcohol use: Not Currently    Comment: Socially   Drug use: Not Currently    Types: Marijuana    Comment: Last smoked  December 2025  [2]  Allergies Allergen Reactions   Codeine Rash   "

## 2024-11-04 ENCOUNTER — Other Ambulatory Visit (HOSPITAL_COMMUNITY): Payer: Self-pay | Admitting: Pharmacist

## 2024-11-04 DIAGNOSIS — O26891 Other specified pregnancy related conditions, first trimester: Secondary | ICD-10-CM | POA: Diagnosis not present

## 2024-11-04 DIAGNOSIS — K117 Disturbances of salivary secretion: Secondary | ICD-10-CM | POA: Diagnosis not present

## 2024-11-04 DIAGNOSIS — R112 Nausea with vomiting, unspecified: Secondary | ICD-10-CM | POA: Diagnosis not present

## 2024-11-04 DIAGNOSIS — Z3A10 10 weeks gestation of pregnancy: Secondary | ICD-10-CM | POA: Diagnosis not present

## 2024-11-04 MED ORDER — GLYCOPYRROLATE 1 MG PO TABS: 2.0000 mg | ORAL_TABLET | Freq: Three times a day (TID) | ORAL | 2 refills | Status: DC

## 2024-11-04 MED ORDER — SODIUM CHLORIDE 0.9 % IV SOLN
25.0000 mg | Freq: Once | INTRAVENOUS | Status: AC
  Administered 2024-11-04: 25 mg via INTRAVENOUS
  Filled 2024-11-04: qty 1

## 2024-11-05 NOTE — Progress Notes (Addendum)
 Plan to re-treat with fluids:  Per Jamilla: if you book >a week out, she will likely need another L of fluid. She was very dehydrated in MAU and if our regimen at home doesn't work, she will be dehydrated again by the time she gets to you.   Plan: LR over one hour with IV diphenhydramine  25mg  with ondansetron  8mg  IVPB, famotidine  20mg  IVPB, and glycopyrrolate  0.1mg  IV  Scheduling team Cc'd on Secure Chat.  Sherry Pennant, PharmD, MPH, BCPS, CPP Clinical Pharmacist

## 2024-11-05 NOTE — Progress Notes (Signed)
 Secure chat sent to [facility] regarding IV fluid orders. Patient received three fluid treatments while inpatient.  Confirming need for two infusions outpatient  Aidan Caloca, PharmD, MPH, BCPS, CPP Clinical Pharmacist

## 2024-11-06 ENCOUNTER — Inpatient Hospital Stay (HOSPITAL_COMMUNITY)
Admit: 2024-11-06 | Discharge: 2024-11-06 | Disposition: A | Payer: Self-pay | Attending: Obstetrics and Gynecology | Admitting: Obstetrics and Gynecology

## 2024-11-06 ENCOUNTER — Other Ambulatory Visit: Payer: Self-pay

## 2024-11-06 ENCOUNTER — Encounter (HOSPITAL_COMMUNITY): Payer: Self-pay | Admitting: Certified Nurse Midwife

## 2024-11-06 ENCOUNTER — Encounter (HOSPITAL_COMMUNITY): Payer: Self-pay | Admitting: Obstetrics and Gynecology

## 2024-11-06 DIAGNOSIS — O210 Mild hyperemesis gravidarum: Secondary | ICD-10-CM | POA: Diagnosis not present

## 2024-11-06 DIAGNOSIS — O26891 Other specified pregnancy related conditions, first trimester: Secondary | ICD-10-CM | POA: Diagnosis not present

## 2024-11-06 DIAGNOSIS — E876 Hypokalemia: Secondary | ICD-10-CM

## 2024-11-06 DIAGNOSIS — E860 Dehydration: Secondary | ICD-10-CM | POA: Diagnosis not present

## 2024-11-06 DIAGNOSIS — Z3A10 10 weeks gestation of pregnancy: Secondary | ICD-10-CM | POA: Diagnosis not present

## 2024-11-06 DIAGNOSIS — O99280 Endocrine, nutritional and metabolic diseases complicating pregnancy, unspecified trimester: Secondary | ICD-10-CM

## 2024-11-06 DIAGNOSIS — O99281 Endocrine, nutritional and metabolic diseases complicating pregnancy, first trimester: Secondary | ICD-10-CM

## 2024-11-06 DIAGNOSIS — O211 Hyperemesis gravidarum with metabolic disturbance: Secondary | ICD-10-CM | POA: Insufficient documentation

## 2024-11-06 DIAGNOSIS — R109 Unspecified abdominal pain: Secondary | ICD-10-CM

## 2024-11-06 LAB — CBC
HCT: 37.8 % (ref 36.0–46.0)
Hemoglobin: 13.5 g/dL (ref 12.0–15.0)
MCH: 26.9 pg (ref 26.0–34.0)
MCHC: 35.7 g/dL (ref 30.0–36.0)
MCV: 75.4 fL — ABNORMAL LOW (ref 80.0–100.0)
Platelets: 277 10*3/uL (ref 150–400)
RBC: 5.01 MIL/uL (ref 3.87–5.11)
RDW: 13.4 % (ref 11.5–15.5)
WBC: 7.2 10*3/uL (ref 4.0–10.5)
nRBC: 0 % (ref 0.0–0.2)

## 2024-11-06 LAB — COMPREHENSIVE METABOLIC PANEL WITH GFR
ALT: 63 U/L — ABNORMAL HIGH (ref 0–44)
AST: 36 U/L (ref 15–41)
Albumin: 4.6 g/dL (ref 3.5–5.0)
Alkaline Phosphatase: 81 U/L (ref 38–126)
Anion gap: 15 (ref 5–15)
BUN: 10 mg/dL (ref 6–20)
CO2: 24 mmol/L (ref 22–32)
Calcium: 10.2 mg/dL (ref 8.9–10.3)
Chloride: 97 mmol/L — ABNORMAL LOW (ref 98–111)
Creatinine, Ser: 0.66 mg/dL (ref 0.44–1.00)
GFR, Estimated: >60 mL/min
Glucose, Bld: 91 mg/dL (ref 70–99)
Potassium: 3.1 mmol/L — ABNORMAL LOW (ref 3.5–5.1)
Sodium: 136 mmol/L (ref 135–145)
Total Bilirubin: 2.3 mg/dL — ABNORMAL HIGH (ref 0.0–1.2)
Total Protein: 7.9 g/dL (ref 6.5–8.1)

## 2024-11-06 LAB — URINALYSIS, ROUTINE W REFLEX MICROSCOPIC
Glucose, UA: NEGATIVE mg/dL
Hgb urine dipstick: NEGATIVE
Ketones, ur: 80 mg/dL — AB
Nitrite: NEGATIVE
Protein, ur: 30 mg/dL — AB
Specific Gravity, Urine: 1.024 (ref 1.005–1.030)
pH: 5 (ref 5.0–8.0)

## 2024-11-06 LAB — MAGNESIUM: Magnesium: 1.8 mg/dL (ref 1.7–2.4)

## 2024-11-06 MED ORDER — LACTATED RINGERS IV BOLUS
1000.0000 mL | Freq: Once | INTRAVENOUS | Status: AC
  Administered 2024-11-06: 1000 mL via INTRAVENOUS

## 2024-11-06 MED ORDER — SCOPOLAMINE 1 MG/3DAYS TD PT72
1.0000 | MEDICATED_PATCH | TRANSDERMAL | Status: DC
  Administered 2024-11-06: 1 mg via TRANSDERMAL
  Filled 2024-11-06: qty 1

## 2024-11-06 MED ORDER — POTASSIUM CHLORIDE CRYS ER 20 MEQ PO TBCR: 40.0000 meq | EXTENDED_RELEASE_TABLET | Freq: Every day | ORAL | 0 refills | Status: AC

## 2024-11-06 MED ORDER — GLYCOPYRROLATE 0.2 MG/ML IJ SOLN
0.2000 mg | Freq: Once | INTRAMUSCULAR | Status: AC
  Administered 2024-11-06: 0.2 mg via INTRAVENOUS
  Filled 2024-11-06: qty 1

## 2024-11-06 MED ORDER — ONDANSETRON HCL 4 MG/2ML IJ SOLN
4.0000 mg | Freq: Once | INTRAMUSCULAR | Status: AC
  Administered 2024-11-06: 4 mg via INTRAVENOUS
  Filled 2024-11-06: qty 2

## 2024-11-06 MED ORDER — FAMOTIDINE IN NACL 20-0.9 MG/50ML-% IV SOLN
20.0000 mg | Freq: Once | INTRAVENOUS | Status: AC
  Administered 2024-11-06: 20 mg via INTRAVENOUS
  Filled 2024-11-06: qty 50

## 2024-11-06 MED ORDER — FAMOTIDINE 20 MG PO TABS: 20.0000 mg | ORAL_TABLET | Freq: Two times a day (BID) | ORAL | 0 refills | Status: AC | PRN

## 2024-11-06 NOTE — MAU Note (Addendum)
 Shirley Navarro is a 29 y.o. at [redacted]w[redacted]d here in MAU reporting: vomiting 6 times in last 24 hours- Not able to hold anything down, back and abd pain yesterday evening, weak. Denies VB, S/S UTI Zofran  taken this morning with no relief Noted pt spitting during triage- Robinol made  me feel high LMP: -- Onset of complaint: yesterday evening Pain score: 7/10 back 7/10 abd lower Vitals:   11/06/24 1010  BP: 126/85  Pulse: 89  Resp: 19  Temp: 98.4 F (36.9 C)  SpO2: 100%     FHT: na  Lab orders placed from triage: ua
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
## 2024-11-06 NOTE — MAU Note (Signed)
 Pt reports spitting has improved and stomach feels a little more at ease. Ice chips, water and crackers provided to pt for oral challenge.

## 2024-11-12 ENCOUNTER — Inpatient Hospital Stay (HOSPITAL_COMMUNITY)
Admission: AD | Admit: 2024-11-12 | Discharge: 2024-11-13 | Disposition: A | Payer: Self-pay | Attending: Obstetrics and Gynecology | Admitting: Obstetrics and Gynecology

## 2024-11-12 ENCOUNTER — Encounter (HOSPITAL_COMMUNITY): Payer: Self-pay | Admitting: Obstetrics and Gynecology

## 2024-11-12 DIAGNOSIS — O210 Mild hyperemesis gravidarum: Secondary | ICD-10-CM

## 2024-11-12 DIAGNOSIS — R7401 Elevation of levels of liver transaminase levels: Secondary | ICD-10-CM | POA: Insufficient documentation

## 2024-11-12 DIAGNOSIS — Z3A11 11 weeks gestation of pregnancy: Secondary | ICD-10-CM | POA: Insufficient documentation

## 2024-11-12 DIAGNOSIS — O211 Hyperemesis gravidarum with metabolic disturbance: Secondary | ICD-10-CM | POA: Insufficient documentation

## 2024-11-12 DIAGNOSIS — E876 Hypokalemia: Secondary | ICD-10-CM

## 2024-11-12 DIAGNOSIS — R748 Abnormal levels of other serum enzymes: Secondary | ICD-10-CM

## 2024-11-12 DIAGNOSIS — E860 Dehydration: Secondary | ICD-10-CM

## 2024-11-12 LAB — URINALYSIS, ROUTINE W REFLEX MICROSCOPIC
Glucose, UA: NEGATIVE mg/dL
Hgb urine dipstick: NEGATIVE
Ketones, ur: 80 mg/dL — AB
Nitrite: NEGATIVE
Protein, ur: 30 mg/dL — AB
Specific Gravity, Urine: 1.024 (ref 1.005–1.030)
pH: 5 (ref 5.0–8.0)

## 2024-11-12 MED ORDER — LACTATED RINGERS IV BOLUS
1000.0000 mL | Freq: Once | INTRAVENOUS | Status: AC
  Administered 2024-11-12: 1000 mL via INTRAVENOUS

## 2024-11-12 NOTE — MAU Note (Signed)
 Shirley Navarro is a 29 y.o. at [redacted]w[redacted]d here in MAU reporting being unable to keep down anything for 3 days. Dizzy and keeps falling. Pain in her lower abd  LMP: na Onset of complaint: 3 days Pain score: 7 Vitals:   11/12/24 2150 11/12/24 2155  BP:  (!) 118/97  Pulse: (!) 107   Resp: 19   Temp: 98 F (36.7 C)   SpO2: 100%      FHT: 163  Lab orders placed from triage: [id number]

## 2024-11-12 NOTE — MAU Provider Note (Incomplete)
" °  History     CSN: [id number]  Arrival date and time: 11/12/24 2122 First Provider Initiated Contact with Patient   Chief Complaint  Patient presents with   Emesis During Pregnancy    HPI Shirley Navarro is a 29 y.o. G2P1001 at [redacted]w[redacted]d, 06/02/2025, by Last Menstrual Period, who presents to the Maternity Assessment Unit for ***.  ROS Contractions: {yes/no:20286}  Fetal Movement: {yes/no:20286}  Vaginal bleeding: {yes/no:20286}  LOF: {yes/no:20286}   (+)  (-)    Past Medical History:  Diagnosis Date   Asthma    Sickle cell trait in mother affecting pregnancy 06/24/2020   Past Surgical History:  Procedure Laterality Date   NO PAST SURGERIES     Allergies[1]  Physical Exam  BP (!) 118/97   Pulse (!) 107   Temp 98 F (36.7 C)   Resp 19   Ht 5' 5 (1.651 m)   Wt 63.5 kg   LMP 08/26/2024   SpO2 100%   BMI 23.30 kg/m   Gen: alert, no acute distress CV: regular rate*** and rhythm*** Resp: nonlabored Abd: nontender*** gravid*** Extremities: trace*** pedal edema  Cervical Exam    FHT Baseline: *** bpm Variability: {:31519} Accelerations: {:31520} Decelerations: {FHR DECEL PRESENT:31526} Uterine activity: {Uterine contractions:31516}  Bedside Ultrasound {Bedside US :29431} My interpretation: {MAU Bedside US  Findings:29432}  Labs     Results for orders placed or performed during the hospital encounter of 11/12/24 (from the past 24 hours)  Urinalysis, Routine w reflex microscopic -Urine, Clean Catch     Status: Abnormal   Collection Time: 11/12/24 10:27 PM  Result Value Ref Range   Color, Urine AMBER (A) YELLOW   APPearance HAZY (A) CLEAR   Specific Gravity, Urine 1.024 1.005 - 1.030   pH 5.0 5.0 - 8.0   Glucose, UA NEGATIVE NEGATIVE mg/dL   Hgb urine dipstick NEGATIVE NEGATIVE   Bilirubin Urine SMALL (A) NEGATIVE   Ketones, ur 80 (A) NEGATIVE mg/dL   Protein, ur 30 (A) NEGATIVE mg/dL   Nitrite NEGATIVE NEGATIVE   Leukocytes,Ua SMALL (A)  NEGATIVE   RBC / HPF 0-5 0 - 5 RBC/hpf   WBC, UA 6-10 0 - 5 WBC/hpf   Bacteria, UA RARE (A) NONE SEEN   Squamous Epithelial / HPF 6-10 0 - 5 /HPF   Mucus PRESENT    Hyaline Casts, UA PRESENT     Imaging US  OB Comp Less 14 Wks with OB Transvaginal ***   Assessment and Plan  MDM Shirley Navarro is a 29 y.o. G2P1001 at [redacted]w[redacted]d, 06/02/2025, by Last Menstrual Period, who presents to the MAU for ***. Ddx: {FJLIIK:67536} ***.  Orders Placed This Encounter  Procedures   Urinalysis, Routine w reflex microscopic -Urine, Clean Catch   No orders of the defined types were placed in this encounter.   Pt feeling *** improved after ***  1. Hyperemesis arising during pregnancy (Primary)  Results pending at the time of DC: Ucx, *** Dispo: DC home in stable condition with return precautions discussed and included in AVS.    Barabara Maier, DO [facility]      [1] Allergies Allergen Reactions   Codeine Rash  "

## 2024-11-13 LAB — COMPREHENSIVE METABOLIC PANEL WITH GFR
ALT: 283 U/L — ABNORMAL HIGH (ref 0–44)
AST: 104 U/L — ABNORMAL HIGH (ref 15–41)
Albumin: 4.6 g/dL (ref 3.5–5.0)
Alkaline Phosphatase: 105 U/L (ref 38–126)
Anion gap: 18 — ABNORMAL HIGH (ref 5–15)
BUN: 13 mg/dL (ref 6–20)
CO2: 23 mmol/L (ref 22–32)
Calcium: 10.6 mg/dL — ABNORMAL HIGH (ref 8.9–10.3)
Chloride: 94 mmol/L — ABNORMAL LOW (ref 98–111)
Creatinine, Ser: 0.68 mg/dL (ref 0.44–1.00)
GFR, Estimated: >60 mL/min
Glucose, Bld: 107 mg/dL — ABNORMAL HIGH (ref 70–99)
Potassium: 2.9 mmol/L — ABNORMAL LOW (ref 3.5–5.1)
Sodium: 135 mmol/L (ref 135–145)
Total Bilirubin: 2.1 mg/dL — ABNORMAL HIGH (ref 0.0–1.2)
Total Protein: 8.3 g/dL — ABNORMAL HIGH (ref 6.5–8.1)

## 2024-11-13 LAB — CBC
HCT: 42.7 % (ref 36.0–46.0)
Hemoglobin: 15.1 g/dL — ABNORMAL HIGH (ref 12.0–15.0)
MCH: 26.5 pg (ref 26.0–34.0)
MCHC: 35.4 g/dL (ref 30.0–36.0)
MCV: 75 fL — ABNORMAL LOW (ref 80.0–100.0)
Platelets: 332 10*3/uL (ref 150–400)
RBC: 5.69 MIL/uL — ABNORMAL HIGH (ref 3.87–5.11)
RDW: 13.2 % (ref 11.5–15.5)
WBC: 7.8 10*3/uL (ref 4.0–10.5)
nRBC: 0 % (ref 0.0–0.2)

## 2024-11-13 LAB — HEPATIC FUNCTION PANEL
ALT: 288 U/L — ABNORMAL HIGH (ref 0–44)
AST: 101 U/L — ABNORMAL HIGH (ref 15–41)
Albumin: 4.8 g/dL (ref 3.5–5.0)
Alkaline Phosphatase: 107 U/L (ref 38–126)
Bilirubin, Direct: 1.5 mg/dL — ABNORMAL HIGH (ref 0.0–0.2)
Indirect Bilirubin: 0.6 mg/dL (ref 0.3–0.9)
Total Bilirubin: 2.1 mg/dL — ABNORMAL HIGH (ref 0.0–1.2)
Total Protein: 7.9 g/dL (ref 6.5–8.1)

## 2024-11-13 LAB — HEPATITIS PANEL, ACUTE
HCV Ab: NONREACTIVE
Hep A IgM: NONREACTIVE
Hep B C IgM: NONREACTIVE
Hepatitis B Surface Ag: NONREACTIVE

## 2024-11-13 MED ORDER — ONDANSETRON HCL 4 MG/2ML IJ SOLN
4.0000 mg | Freq: Once | INTRAMUSCULAR | Status: AC
  Administered 2024-11-13: 4 mg via INTRAVENOUS
  Filled 2024-11-13: qty 2

## 2024-11-13 MED ORDER — POTASSIUM CHLORIDE 10 MEQ/100ML IV SOLN
10.0000 meq | Freq: Once | INTRAVENOUS | Status: AC
  Administered 2024-11-13: 10 meq via INTRAVENOUS
  Filled 2024-11-13: qty 100

## 2024-11-13 MED ORDER — SODIUM CHLORIDE 0.9 % IV SOLN
12.5000 mg | Freq: Once | INTRAVENOUS | Status: AC
  Administered 2024-11-13: 12.5 mg via INTRAVENOUS
  Filled 2024-11-13: qty 0.5

## 2024-11-13 MED ORDER — POTASSIUM CHLORIDE 2 MEQ/ML IV SOLN
INTRAVENOUS | Status: DC
  Filled 2024-11-13 (×13): qty 1000

## 2024-11-13 MED ORDER — LACTATED RINGERS IV BOLUS
1000.0000 mL | Freq: Once | INTRAVENOUS | Status: AC
  Administered 2024-11-13: 1000 mL via INTRAVENOUS

## 2024-11-13 MED ORDER — FAMOTIDINE IN NACL 20-0.9 MG/50ML-% IV SOLN
20.0000 mg | Freq: Once | INTRAVENOUS | Status: AC
  Administered 2024-11-13: 20 mg via INTRAVENOUS
  Filled 2024-11-13: qty 50

## 2024-11-13 MED ORDER — LACTATED RINGERS IV SOLN: INTRAVENOUS | Status: DC

## 2024-11-13 NOTE — MAU Provider Note (Signed)
 Chief Complaint: Emesis During Pregnancy   Event Date/Time   First Provider Initiated Contact with Patient 11/13/24 0026      SUBJECTIVE HPI: Shirley Navarro is a 29 y.o. G2P1001 at [redacted]w[redacted]d by LMP who presents to maternity admissions reporting n/v, unable to keep anything down today. She is wearing a scopolamine  patch and took Zofran  at 7 pm last night.  She did not take Phenergan  because she couldn't keep anything down and reports placing the medicine vaginally causes cramping.  There is dizziness and pt reports falling at home r/t dizziness.   Pt receives prenatal care at [facility].   HPI  Past Medical History:  Diagnosis Date   Asthma    Sickle cell trait in mother affecting pregnancy 06/24/2020   Past Surgical History:  Procedure Laterality Date   NO PAST SURGERIES     Social History   Socioeconomic History   Marital status: Married    Spouse name: [name]   Number of children: Not on file   Years of education: Not on file   Highest education level: Not on file  Occupational History   Occupation: Pharmacy Tech  Tobacco Use   Smoking status: Never   Smokeless tobacco: Never  Vaping Use   Vaping status: Never Used  Substance and Sexual Activity   Alcohol use: Not Currently    Comment: Socially   Drug use: Not Currently    Types: Marijuana    Comment: Last smoked  December 2025   Sexual activity: Not Currently  Other Topics Concern   Not on file  Social History Narrative   ** Merged History Encounter **       Social Drivers of Health   Tobacco Use: Low Risk (11/12/2024)   Patient History    Smoking Tobacco Use: Never    Smokeless Tobacco Use: Never    Passive Exposure: Not on file  Financial Resource Strain: Not on file  Food Insecurity: No Food Insecurity (11/06/2024)   Epic    Worried About Programme Researcher, Broadcasting/film/video in the Last Year: Never true    Ran Out of Food in the Last Year: Never true  Transportation Needs: No Transportation Needs (11/06/2024)   Epic     Lack of Transportation (Medical): No    Lack of Transportation (Non-Medical): No  Physical Activity: Not on file  Stress: Not on file  Social Connections: Unknown (11/06/2024)   Social Connection and Isolation Panel    Frequency of Communication with Friends and Family: More than three times a week    Frequency of Social Gatherings with Friends and Family: Three times a week    Attends Religious Services: Patient declined    Active Member of Clubs or Organizations: Patient declined    Attends Banker Meetings: Patient declined    Marital Status: Married  Catering Manager Violence: Not At Risk (11/06/2024)   Epic    Fear of Current or Ex-Partner: No    Emotionally Abused: No    Physically Abused: No    Sexually Abused: No  Depression (PHQ2-9): Medium Risk (11/30/2022)   Depression (PHQ2-9)    PHQ-2 Score: 5  Alcohol Screen: Not on file  Housing: Unknown (11/06/2024)   Epic    Unable to Pay for Housing in the Last Year: No    Number of Times Moved in the Last Year: Not on file    Homeless in the Last Year: No  Utilities: Not At Risk (11/06/2024)   Epic  Threatened with loss of utilities: No  Health Literacy: Not on file   Medications Ordered Prior to Encounter[1] Allergies[2]  ROS:  Review of Systems  Constitutional:  Negative for chills, fatigue and fever.  Respiratory:  Negative for shortness of breath.   Cardiovascular:  Negative for chest pain.  Gastrointestinal:  Positive for nausea and vomiting.  Genitourinary:  Negative for difficulty urinating, dysuria, flank pain, pelvic pain, vaginal bleeding, vaginal discharge and vaginal pain.  Neurological:  Positive for dizziness. Negative for headaches.  Psychiatric/Behavioral: Negative.       I have reviewed patient's Past Medical Hx, Surgical Hx, Family Hx, Social Hx, medications and allergies.   Physical Exam  Patient Vitals for the past 24 hrs:  BP Temp Pulse Resp SpO2 Height Weight  11/13/24 0006 114/80  -- (!) 139 -- 100 % -- --  11/13/24 0002 (!) 121/94 -- (!) 123 -- -- -- --  11/13/24 0001 (!) 132/93 -- 99 -- -- -- --  11/12/24 2155 (!) 118/97 -- -- -- -- -- --  11/12/24 2150 -- 98 F (36.7 C) (!) 107 19 100 % 5' 5 (1.651 m) 63.5 kg   Constitutional: Well-developed, well-nourished female in no acute distress.  Cardiovascular: normal rate Respiratory: normal effort GI: Abd soft, non-tender. Pos BS x 4 MS: Extremities nontender, no edema, normal ROM Neurologic: Alert and oriented x 4.  GU: Neg CVAT.  PELVIC EXAM: deferred  FHT 163 by doppler  LAB RESULTS Results for orders placed or performed during the hospital encounter of 11/12/24 (from the past 24 hours)  Urinalysis, Routine w reflex microscopic -Urine, Clean Catch     Status: Abnormal   Collection Time: 11/12/24 10:27 PM  Result Value Ref Range   Color, Urine AMBER (A) YELLOW   APPearance HAZY (A) CLEAR   Specific Gravity, Urine 1.024 1.005 - 1.030   pH 5.0 5.0 - 8.0   Glucose, UA NEGATIVE NEGATIVE mg/dL   Hgb urine dipstick NEGATIVE NEGATIVE   Bilirubin Urine SMALL (A) NEGATIVE   Ketones, ur 80 (A) NEGATIVE mg/dL   Protein, ur 30 (A) NEGATIVE mg/dL   Nitrite NEGATIVE NEGATIVE   Leukocytes,Ua SMALL (A) NEGATIVE   RBC / HPF 0-5 0 - 5 RBC/hpf   WBC, UA 6-10 0 - 5 WBC/hpf   Bacteria, UA RARE (A) NONE SEEN   Squamous Epithelial / HPF 6-10 0 - 5 /HPF   Mucus PRESENT    Hyaline Casts, UA PRESENT   Comprehensive metabolic panel with GFR     Status: Abnormal   Collection Time: 11/13/24 12:01 AM  Result Value Ref Range   Sodium 135 135 - 145 mmol/L   Potassium 2.9 (L) 3.5 - 5.1 mmol/L   Chloride 94 (L) 98 - 111 mmol/L   CO2 23 22 - 32 mmol/L   Glucose, Bld 107 (H) 70 - 99 mg/dL   BUN 13 6 - 20 mg/dL   Creatinine, Ser 9.31 0.44 - 1.00 mg/dL   Calcium  10.6 (H) 8.9 - 10.3 mg/dL   Total Protein 8.3 (H) 6.5 - 8.1 g/dL   Albumin 4.6 3.5 - 5.0 g/dL   AST 895 (H) 15 - 41 U/L   ALT 283 (H) 0 - 44 U/L   Alkaline  Phosphatase 105 38 - 126 U/L   Total Bilirubin 2.1 (H) 0.0 - 1.2 mg/dL   GFR, Estimated >39 >39 mL/min   Anion gap 18 (H) 5 - 15  CBC     Status: Abnormal   Collection  Time: 11/13/24 12:01 AM  Result Value Ref Range   WBC 7.8 4.0 - 10.5 K/uL   RBC 5.69 (H) 3.87 - 5.11 MIL/uL   Hemoglobin 15.1 (H) 12.0 - 15.0 g/dL   HCT 57.2 63.9 - 53.9 %   MCV 75.0 (L) 80.0 - 100.0 fL   MCH 26.5 26.0 - 34.0 pg   MCHC 35.4 30.0 - 36.0 g/dL   RDW 86.7 88.4 - 84.4 %   Platelets 332 150 - 400 K/uL   nRBC 0.0 0.0 - 0.2 %  Hepatic function panel     Status: Abnormal   Collection Time: 11/13/24 12:01 AM  Result Value Ref Range   Total Protein 7.9 6.5 - 8.1 g/dL   Albumin 4.8 3.5 - 5.0 g/dL   AST 898 (H) 15 - 41 U/L   ALT 288 (H) 0 - 44 U/L   Alkaline Phosphatase 107 38 - 126 U/L   Total Bilirubin 2.1 (H) 0.0 - 1.2 mg/dL   Bilirubin, Direct 1.5 (H) 0.0 - 0.2 mg/dL   Indirect Bilirubin 0.6 0.3 - 0.9 mg/dL       IMAGING No results found.  MAU Management/MDM: Orders Placed This Encounter  Procedures   Culture, OB Urine   Urinalysis, Routine w reflex microscopic -Urine, Clean Catch   Comprehensive metabolic panel with GFR   CBC   Hepatic function panel   Hepatitis panel, acute   Orthostatic vital signs   Discharge patient Discharge disposition: 01-Home or Self Care; Discharge patient date: 11/13/2024    Meds ordered this encounter  Medications   lactated ringers  bolus 1,000 mL   ondansetron  (ZOFRAN ) injection 4 mg   famotidine  (PEPCID ) IVPB 20 mg premix   promethazine  (PHENERGAN ) 12.5 mg in sodium chloride  0.9 % 1,000 mL infusion   lactated ringers  bolus 1,000 mL   DISCONTD: lactated ringers  1,000 mL with potassium chloride  10 mEq infusion   potassium chloride  10 mEq in 100 mL IVPB   lactated ringers  infusion    IUP confirmed by outpatient US  and FHT by doppler today.  Dehydration noted with 80 ketones so IV fluids and antiemetics given. Hypokalemia noted so 10 meqs of K given via IVPB.   Pt to continue oral KDur as prescribed at home.  Continue to use scopolamine  patches, Phenergan  orally (vaginally if not tolerated orally), Zofran  orally, and return to MAU if unable to keep down food/fluids for 24 hours.  Keep scheduled appts with [facility].  Liver enzymes elevated today, no acute abdomen.  Hepatitis panel pending.  Repeat labs in office at later date.  Return precautions reviewed.  ASSESSMENT 1. Hyperemesis arising during pregnancy   2. Elevated liver enzymes   3. Dehydration   4. Hypokalemia due to excessive gastrointestinal loss of potassium   5. Hyperemesis gravidarum   6. [redacted] weeks gestation of pregnancy     PLAN Discharge home Allergies as of 11/13/2024       Reactions   Codeine Rash        Medication List     STOP taking these medications    DICLEGIS PO       TAKE these medications    albuterol  108 (90 Base) MCG/ACT inhaler Commonly known as: VENTOLIN  HFA Inhale 1-2 puffs into the lungs every 6 (six) hours as needed for wheezing or shortness of breath.   famotidine  20 MG tablet Commonly known as: Pepcid  Take 1 tablet (20 mg total) by mouth 2 (two) times daily as needed for heartburn or indigestion.  glycopyrrolate  1 MG tablet Commonly known as: Robinul  Take 1 tablet (1 mg total) by mouth 3 (three) times daily. What changed: Another medication with the same name was removed. Continue taking this medication, and follow the directions you see here.   metoCLOPramide  10 MG tablet Commonly known as: REGLAN  Take 1 tablet (10 mg total) by mouth every 6 (six) hours as needed for nausea.   multivitamin-prenatal 27-0.8 MG Tabs tablet Take 1 tablet by mouth daily at 12 noon.   ondansetron  4 MG disintegrating tablet Commonly known as: ZOFRAN -ODT Take 1-2 tablets (4-8 mg total) by mouth every 8 (eight) hours as needed for nausea or vomiting.   potassium chloride  SA 20 MEQ tablet Commonly known as: KLOR-CON  M Take 2 tablets (40 mEq total) by mouth  daily.   scopolamine  1 MG/3DAYS Commonly known as: TRANSDERM-SCOP Place 1 patch (1 mg total) onto the skin every 3 (three) days.        Follow-up Information     [facility] Follow up.   Specialty: Obstetrics and Gynecology Contact information: [address] [place]  [address] [phone]        [facility] Follow up.   Specialty: Obstetrics and Gynecology Why: As needed for emergencies Contact information: [address] [place]  [address] [phone]                Olam Boards Certified Nurse-Midwife 11/13/2024  5:44 AM         [1]  No current facility-administered medications on file prior to encounter.   Current Outpatient Medications on File Prior to Encounter  Medication Sig Dispense Refill   Doxylamine-Pyridoxine (DICLEGIS PO) Take 1 tablet by mouth daily.     scopolamine  (TRANSDERM-SCOP) 1 MG/3DAYS Place 1 patch (1 mg total) onto the skin every 3 (three) days. 10 patch 12   albuterol  (VENTOLIN  HFA) 108 (90 Base) MCG/ACT inhaler Inhale 1-2 puffs into the lungs every 6 (six) hours as needed for wheezing or shortness of breath. (Patient not taking: Reported on 02/08/2024) 1 each 0   famotidine  (PEPCID ) 20 MG tablet Take 1 tablet (20 mg total) by mouth 2 (two) times daily as needed for heartburn or indigestion. 60 tablet 0   glycopyrrolate  (ROBINUL ) 1 MG tablet Take 1 tablet (1 mg total) by mouth 3 (three) times daily. 90 tablet 0   glycopyrrolate  (ROBINUL ) 1 MG tablet Take 2 tablets (2 mg total) by mouth 3 (three) times daily. (Patient not taking: Reported on 11/06/2024) 180 tablet 2   metoCLOPramide  (REGLAN ) 10 MG tablet Take 1 tablet (10 mg total) by mouth every 6 (six) hours as needed for nausea. (Patient not taking: Reported on 11/06/2024) 30 tablet 0   ondansetron  (ZOFRAN -ODT) 4 MG disintegrating tablet Take 1-2 tablets (4-8 mg total) by  mouth every 8 (eight) hours as needed for nausea or vomiting. 20 tablet 0   potassium chloride  SA (KLOR-CON  M) 20 MEQ tablet Take 2 tablets (40 mEq total) by mouth daily. 10 tablet 0   Prenatal Vit-Fe Fumarate-FA (MULTIVITAMIN-PRENATAL) 27-0.8 MG TABS tablet Take 1 tablet by mouth daily at 12 noon.    [2]  Allergies Allergen Reactions   Codeine Rash

## 2024-11-14 ENCOUNTER — Other Ambulatory Visit: Payer: Self-pay

## 2024-11-14 ENCOUNTER — Encounter (HOSPITAL_COMMUNITY): Payer: Self-pay | Admitting: Obstetrics & Gynecology

## 2024-11-14 ENCOUNTER — Inpatient Hospital Stay (HOSPITAL_COMMUNITY)
Admission: AD | Admit: 2024-11-14 | Source: Ambulatory Visit | Attending: Obstetrics & Gynecology | Admitting: Obstetrics & Gynecology

## 2024-11-14 DIAGNOSIS — O210 Mild hyperemesis gravidarum: Principal | ICD-10-CM | POA: Diagnosis present

## 2024-11-14 LAB — COMPREHENSIVE METABOLIC PANEL WITH GFR
ALT: 238 U/L — ABNORMAL HIGH (ref 0–44)
AST: 83 U/L — ABNORMAL HIGH (ref 15–41)
Albumin: 4.3 g/dL (ref 3.5–5.0)
Alkaline Phosphatase: 91 U/L (ref 38–126)
Anion gap: 16 — ABNORMAL HIGH (ref 5–15)
BUN: 10 mg/dL (ref 6–20)
CO2: 21 mmol/L — ABNORMAL LOW (ref 22–32)
Calcium: 10.1 mg/dL (ref 8.9–10.3)
Chloride: 97 mmol/L — ABNORMAL LOW (ref 98–111)
Creatinine, Ser: 0.66 mg/dL (ref 0.44–1.00)
GFR, Estimated: >60 mL/min
Glucose, Bld: 89 mg/dL (ref 70–99)
Potassium: 3.3 mmol/L — ABNORMAL LOW (ref 3.5–5.1)
Sodium: 134 mmol/L — ABNORMAL LOW (ref 135–145)
Total Bilirubin: 2.1 mg/dL — ABNORMAL HIGH (ref 0.0–1.2)
Total Protein: 7.6 g/dL (ref 6.5–8.1)

## 2024-11-14 LAB — CBC
HCT: 37.9 % (ref 36.0–46.0)
Hemoglobin: 13.3 g/dL (ref 12.0–15.0)
MCH: 26.8 pg (ref 26.0–34.0)
MCHC: 35.1 g/dL (ref 30.0–36.0)
MCV: 76.3 fL — ABNORMAL LOW (ref 80.0–100.0)
Platelets: 260 10*3/uL (ref 150–400)
RBC: 4.97 MIL/uL (ref 3.87–5.11)
RDW: 13.4 % (ref 11.5–15.5)
WBC: 7 10*3/uL (ref 4.0–10.5)
nRBC: 0 % (ref 0.0–0.2)

## 2024-11-14 LAB — URINALYSIS, ROUTINE W REFLEX MICROSCOPIC
Glucose, UA: NEGATIVE mg/dL
Hgb urine dipstick: NEGATIVE
Ketones, ur: 80 mg/dL — AB
Nitrite: NEGATIVE
Protein, ur: 30 mg/dL — AB
Specific Gravity, Urine: 1.024 (ref 1.005–1.030)
pH: 6 (ref 5.0–8.0)

## 2024-11-14 LAB — TSH: TSH: <0.1 u[IU]/mL — ABNORMAL LOW (ref 0.350–4.500)

## 2024-11-14 LAB — CULTURE, OB URINE: Culture: NO GROWTH

## 2024-11-14 LAB — T4, FREE: Free T4: 2.56 ng/dL — ABNORMAL HIGH (ref 0.80–2.00)

## 2024-11-14 MED ORDER — LACTATED RINGERS IV SOLN: 125.0000 mL/h | INTRAVENOUS | Status: DC

## 2024-11-14 MED ORDER — ONDANSETRON HCL 4 MG/2ML IJ SOLN
4.0000 mg | Freq: Once | INTRAMUSCULAR | Status: AC
  Administered 2024-11-14: 4 mg via INTRAVENOUS
  Filled 2024-11-14: qty 2

## 2024-11-14 MED ORDER — FAMOTIDINE 20 MG PO TABS
20.0000 mg | ORAL_TABLET | Freq: Two times a day (BID) | ORAL | Status: AC
  Administered 2024-11-14 – 2024-11-15 (×2): 20 mg via ORAL
  Filled 2024-11-14 (×2): qty 1

## 2024-11-14 MED ORDER — FAMOTIDINE IN NACL 20-0.9 MG/50ML-% IV SOLN
20.0000 mg | Freq: Two times a day (BID) | INTRAVENOUS | Status: AC
  Administered 2024-11-14 – 2024-11-15 (×2): 20 mg via INTRAVENOUS
  Filled 2024-11-14 (×3): qty 50

## 2024-11-14 MED ORDER — DOCUSATE SODIUM 100 MG PO CAPS: 100.0000 mg | ORAL_CAPSULE | Freq: Every day | ORAL | Status: DC

## 2024-11-14 MED ORDER — CALCIUM CARBONATE ANTACID 500 MG PO CHEW: 2.0000 | CHEWABLE_TABLET | ORAL | Status: AC | PRN

## 2024-11-14 MED ORDER — PRENATAL MULTIVITAMIN CH: 1.0000 | ORAL_TABLET | Freq: Every day | ORAL | Status: AC

## 2024-11-14 MED ORDER — HYDROXYZINE HCL 50 MG PO TABS: 50.0000 mg | ORAL_TABLET | Freq: Four times a day (QID) | ORAL | Status: AC | PRN

## 2024-11-14 MED ORDER — SODIUM CHLORIDE 0.9 % IV SOLN: 8.0000 mg | Freq: Three times a day (TID) | INTRAVENOUS | Status: AC | PRN

## 2024-11-14 MED ORDER — ONDANSETRON 4 MG PO TBDP: 4.0000 mg | ORAL_TABLET | Freq: Three times a day (TID) | ORAL | Status: AC | PRN

## 2024-11-14 MED ORDER — METOCLOPRAMIDE HCL 10 MG PO TABS
10.0000 mg | ORAL_TABLET | Freq: Four times a day (QID) | ORAL | Status: AC
  Administered 2024-11-15: 10 mg via ORAL
  Filled 2024-11-14: qty 1

## 2024-11-14 MED ORDER — LACTATED RINGERS IV SOLN: INTRAVENOUS | Status: DC

## 2024-11-14 MED ORDER — LACTATED RINGERS IV BOLUS
1000.0000 mL | Freq: Once | INTRAVENOUS | Status: AC
  Administered 2024-11-14: 1000 mL via INTRAVENOUS

## 2024-11-14 MED ORDER — PROMETHAZINE HCL 25 MG RE SUPP
12.5000 mg | Freq: Four times a day (QID) | RECTAL | Status: AC
  Administered 2024-11-15: 25 mg via RECTAL
  Filled 2024-11-14 (×8): qty 1

## 2024-11-14 MED ORDER — ACETAMINOPHEN 325 MG PO TABS: 650.0000 mg | ORAL_TABLET | ORAL | Status: DC | PRN

## 2024-11-14 MED ORDER — ONDANSETRON HCL 4 MG/2ML IJ SOLN
4.0000 mg | Freq: Three times a day (TID) | INTRAMUSCULAR | Status: AC | PRN
  Administered 2024-11-14 – 2024-11-15 (×2): 4 mg via INTRAVENOUS
  Filled 2024-11-14 (×2): qty 2

## 2024-11-14 MED ORDER — HYDROXYZINE HCL 50 MG/ML IM SOLN: 50.0000 mg | Freq: Four times a day (QID) | INTRAMUSCULAR | Status: AC | PRN

## 2024-11-14 MED ORDER — PROMETHAZINE HCL 25 MG PO TABS
12.5000 mg | ORAL_TABLET | Freq: Four times a day (QID) | ORAL | Status: AC
  Administered 2024-11-14 – 2024-11-15 (×4): 25 mg via ORAL
  Filled 2024-11-14 (×4): qty 1

## 2024-11-14 MED ORDER — METOCLOPRAMIDE HCL 5 MG/ML IJ SOLN
10.0000 mg | Freq: Four times a day (QID) | INTRAMUSCULAR | Status: AC
  Administered 2024-11-14 – 2024-11-15 (×5): 10 mg via INTRAVENOUS
  Filled 2024-11-14 (×6): qty 2

## 2024-11-14 NOTE — H&P (Signed)
 "   Shirley Navarro is a 29 y.o. female, G2P1001, IUP at 11.3 weeks, presenting for Hyperemesis gravidarum. Pt endorse + Fm. Denies vaginal leakage. Denies vaginal bleeding. Denies feeling cxt's.     Patient Active Problem List   Diagnosis Date Noted   Hyperemesis arising during pregnancy 11/02/2024   Breakthrough bleeding on depo provera  08/15/2022   Dysmenorrhea 08/15/2022     Active Ambulatory Problems    Diagnosis Date Noted   Breakthrough bleeding on depo provera  08/15/2022   Dysmenorrhea 08/15/2022   Hyperemesis arising during pregnancy 11/02/2024   Resolved Ambulatory Problems    Diagnosis Date Noted   Supervision of normal first pregnancy, antepartum 06/01/2020   Sickle cell trait in mother affecting pregnancy 06/24/2020   Group beta Strep positive 08/28/2020   Indication for care or intervention in labor or delivery 09/18/2020   Vaginal delivery 09/19/2020   Past Medical History:  Diagnosis Date   Asthma       Medications Prior to Admission  Medication Sig Dispense Refill Last Dose/Taking   ondansetron  (ZOFRAN -ODT) 4 MG disintegrating tablet Take 1-2 tablets (4-8 mg total) by mouth every 8 (eight) hours as needed for nausea or vomiting. 20 tablet 0 11/13/2024 at 11:00 PM   albuterol  (VENTOLIN  HFA) 108 (90 Base) MCG/ACT inhaler Inhale 1-2 puffs into the lungs every 6 (six) hours as needed for wheezing or shortness of breath. (Patient not taking: Reported on 02/08/2024) 1 each 0    famotidine  (PEPCID ) 20 MG tablet Take 1 tablet (20 mg total) by mouth 2 (two) times daily as needed for heartburn or indigestion. 60 tablet 0    glycopyrrolate  (ROBINUL ) 1 MG tablet Take 1 tablet (1 mg total) by mouth 3 (three) times daily. 90 tablet 0    metoCLOPramide  (REGLAN ) 10 MG tablet Take 1 tablet (10 mg total) by mouth every 6 (six) hours as needed for nausea. (Patient not taking: Reported on 11/06/2024) 30 tablet 0    potassium chloride  SA (KLOR-CON  M) 20 MEQ tablet Take 2 tablets  (40 mEq total) by mouth daily. 10 tablet 0    Prenatal Vit-Fe Fumarate-FA (MULTIVITAMIN-PRENATAL) 27-0.8 MG TABS tablet Take 1 tablet by mouth daily at 12 noon.      scopolamine  (TRANSDERM-SCOP) 1 MG/3DAYS Place 1 patch (1 mg total) onto the skin every 3 (three) days. 10 patch 12     Past Medical History:  Diagnosis Date   Asthma    Sickle cell trait in mother affecting pregnancy 06/24/2020     Medications Ordered Prior to Encounter[1]   Allergies[2]   OB History     Gravida  2   Para  1   Term  1   Preterm      AB      Living  1      SAB      IAB      Ectopic      Multiple  0   Live Births  1          Past Medical History:  Diagnosis Date   Asthma    Sickle cell trait in mother affecting pregnancy 06/24/2020   Past Surgical History:  Procedure Laterality Date   NO PAST SURGERIES     Family History: family history includes Breast cancer in her paternal aunt and paternal grandmother; Diabetes in her maternal grandmother; Healthy in her father and mother. Social History:  reports that she has never smoked. She has never used smokeless tobacco. She reports that  she does not currently use alcohol. She reports that she does not currently use drugs after having used the following drugs: Marijuana.  ROS:  Review of Systems  Constitutional: Negative.   HENT: Negative.    Eyes: Negative.   Respiratory: Negative.    Cardiovascular: Negative.   Gastrointestinal:  Positive for abdominal pain, nausea and vomiting.  Genitourinary: Negative.   Musculoskeletal: Negative.   Skin: Negative.   Neurological:  Positive for dizziness.  Endo/Heme/Allergies: Negative.   Psychiatric/Behavioral: Negative.       Physical Exam: BP 131/69 (BP Location: Right Arm)   Pulse 99   Temp 98.3 F (36.8 C)   Resp 18   Ht 5' 5 (1.651 m)   Wt 64 kg   LMP 08/26/2024   SpO2 100%   BMI 23.46 kg/m   Physical Exam HENT:     Head: Normocephalic and atraumatic.     Nose:  Nose normal.     Mouth/Throat:     Mouth: Mucous membranes are dry.  Eyes:     Pupils: Pupils are equal, round, and reactive to light.  Cardiovascular:     Rate and Rhythm: Normal rate and regular rhythm.     Pulses: Normal pulses.     Heart sounds: Normal heart sounds.  Pulmonary:     Effort: Pulmonary effort is normal.     Breath sounds: Normal breath sounds.  Abdominal:     General: Bowel sounds are normal.  Musculoskeletal:        General: Normal range of motion.     Cervical back: Normal range of motion and neck supple.  Skin:    General: Skin is warm.     Capillary Refill: Capillary refill takes less than 2 seconds.  Neurological:     General: No focal deficit present.     Mental Status: She is alert.      Labs: No results found for this or any previous visit (from the past 24 hours).  Imaging:  No results found.  MAU Course: Orders Placed This Encounter  Procedures   Urinalysis, Routine w reflex microscopic -Urine, Clean Catch   CBC   Comprehensive metabolic panel   Meds ordered this encounter  Medications   lactated ringers  bolus 1,000 mL   ondansetron  (ZOFRAN ) injection 4 mg    Assessment/Plan: [name] is a 29 y.o. female, G2P1001, IUP at 11.3 weeks, presenting for Hyperemesis gravidarum. Pt endorse + Fm. Denies vaginal leakage. Denies vaginal bleeding. Denies feeling cxt's.   Plan: Ad[facility] to [facility] per consult with DR Armond Routine [facility] orders Clear liquids Reglan  and phenergan  scheduled Zofran  PRN Pepcid   May continue scopolamine  current present on pt.  CMP pending, plan to replenish if indicated. And Recheck cmp in the morning  TSH and Free T4 pending.    Shirley Navarro  CNM, FNP-C, PMHNP-BC  [address]  [place], [place] [address]  Cell: [phone]  Office Phone: [phone] Fax: [phone] 11/14/2024  2:18 PM         [1]  No current facility-administered medications on file prior to encounter.   Current  Outpatient Medications on File Prior to Encounter  Medication Sig Dispense Refill   ondansetron  (ZOFRAN -ODT) 4 MG disintegrating tablet Take 1-2 tablets (4-8 mg total) by mouth every 8 (eight) hours as needed for nausea or vomiting. 20 tablet 0   albuterol  (VENTOLIN  HFA) 108 (90 Base) MCG/ACT inhaler Inhale 1-2 puffs into the lungs every 6 (six) hours as needed for wheezing or  shortness of breath. (Patient not taking: Reported on 02/08/2024) 1 each 0   famotidine  (PEPCID ) 20 MG tablet Take 1 tablet (20 mg total) by mouth 2 (two) times daily as needed for heartburn or indigestion. 60 tablet 0   glycopyrrolate  (ROBINUL ) 1 MG tablet Take 1 tablet (1 mg total) by mouth 3 (three) times daily. 90 tablet 0   metoCLOPramide  (REGLAN ) 10 MG tablet Take 1 tablet (10 mg total) by mouth every 6 (six) hours as needed for nausea. (Patient not taking: Reported on 11/06/2024) 30 tablet 0   potassium chloride  SA (KLOR-CON  M) 20 MEQ tablet Take 2 tablets (40 mEq total) by mouth daily. 10 tablet 0   Prenatal Vit-Fe Fumarate-FA (MULTIVITAMIN-PRENATAL) 27-0.8 MG TABS tablet Take 1 tablet by mouth daily at 12 noon.     scopolamine  (TRANSDERM-SCOP) 1 MG/3DAYS Place 1 patch (1 mg total) onto the skin every 3 (three) days. 10 patch 12  [2]  Allergies Allergen Reactions   Codeine Rash   "

## 2024-11-14 NOTE — MAU Note (Signed)
 Shirley Navarro is a 29 y.o. at [redacted]w[redacted]d here in MAU reporting: not able to keep anything down. Taking zofran  last night still woke up vomiting in the middle of the night. Feeloing dizzy and light headed.denies any pain  LMP:  Onset of complaint: last night Pain score: 0  Vitals:   11/14/24 1319  BP: 121/80  Pulse: 93  Resp: 18  Temp: 98.3 F (36.8 C)     FHT: n/a  Lab orders placed from triage: u/a

## 2024-11-15 DIAGNOSIS — O210 Mild hyperemesis gravidarum: Secondary | ICD-10-CM | POA: Diagnosis present

## 2024-11-15 LAB — COMPREHENSIVE METABOLIC PANEL WITH GFR
ALT: 185 U/L — ABNORMAL HIGH (ref 0–44)
AST: 68 U/L — ABNORMAL HIGH (ref 15–41)
Albumin: 3.3 g/dL — ABNORMAL LOW (ref 3.5–5.0)
Alkaline Phosphatase: 68 U/L (ref 38–126)
Anion gap: 10 (ref 5–15)
BUN: 7 mg/dL (ref 6–20)
CO2: 25 mmol/L (ref 22–32)
Calcium: 9 mg/dL (ref 8.9–10.3)
Chloride: 99 mmol/L (ref 98–111)
Creatinine, Ser: 0.65 mg/dL (ref 0.44–1.00)
GFR, Estimated: >60 mL/min
Glucose, Bld: 88 mg/dL (ref 70–99)
Potassium: 2.7 mmol/L — CL (ref 3.5–5.1)
Sodium: 134 mmol/L — ABNORMAL LOW (ref 135–145)
Total Bilirubin: 2.2 mg/dL — ABNORMAL HIGH (ref 0.0–1.2)
Total Protein: 5.7 g/dL — ABNORMAL LOW (ref 6.5–8.1)

## 2024-11-15 LAB — MAGNESIUM: Magnesium: 1.6 mg/dL — ABNORMAL LOW (ref 1.7–2.4)

## 2024-11-15 MED ORDER — MAGNESIUM SULFATE 2 GM/50ML IV SOLN
2.0000 g | Freq: Once | INTRAVENOUS | Status: AC
  Administered 2024-11-15: 2 g via INTRAVENOUS
  Filled 2024-11-15: qty 50

## 2024-11-15 MED ORDER — POTASSIUM CHLORIDE 10 MEQ/100ML IV SOLN
10.0000 meq | INTRAVENOUS | Status: AC
  Administered 2024-11-15 (×4): 10 meq via INTRAVENOUS
  Filled 2024-11-15 (×4): qty 100

## 2024-11-15 MED ORDER — POTASSIUM CHLORIDE 2 MEQ/ML IV SOLN
INTRAVENOUS | Status: AC
  Filled 2024-11-15 (×2): qty 1000

## 2024-11-15 MED ORDER — LACTATED RINGERS IV BOLUS
1000.0000 mL | Freq: Once | INTRAVENOUS | Status: AC
  Administered 2024-11-15: 1000 mL via INTRAVENOUS

## 2024-11-15 NOTE — Progress Notes (Signed)
 Subjective:    Reports feeling nauseous and vomiting twice today, but states tolerance of clear liquids has improved.  Objective:    VS: BP 127/70 (BP Location: Left Arm)   Pulse 99   Temp 98.4 F (36.9 C) (Oral)   Resp 17   Ht 5' 5 (1.651 m)   Wt 64 kg   LMP 08/26/2024   SpO2 100%   BMI 23.50 kg/m  FHR: doppler has not been done for today at the time of this note   Assessment/Plan:   30 y.o. G2P1001 [redacted]w[redacted]d Hyperemesis gravidarum in first trimester    -continue antiemetics as ordered    -advance diet as tolerated    -doppler FHR Q shift Elevated LFT    -repeat CMP in AM Abnormal thyroid  studies    -out-patient endocrinology referral  Shirley KATHEE Peal DNP, CNM 11/15/2024 1:56 PM

## 2024-11-15 NOTE — Progress Notes (Signed)
 Date and time results received: 11/15/24 93877 (use smartphrase .now to insert current time)  Test: potassium  Critical Value: 2.7  Name of Provider Notified: Dr Izell   Orders Received? Or Actions Taken?: Actions Taken: See new orders
